# Patient Record
Sex: Female | Born: 1995 | Race: White | Hispanic: No | Marital: Single | Smoking: Current every day smoker
Health system: Southern US, Community
[De-identification: ages and names within clinical notes are randomized; demographics above are authoritative.]

## PROBLEM LIST (undated history)

## (undated) DIAGNOSIS — M419 Scoliosis, unspecified: Secondary | ICD-10-CM

## (undated) DIAGNOSIS — E282 Polycystic ovarian syndrome: Secondary | ICD-10-CM

## (undated) HISTORY — PX: NO PAST SURGERIES: SHX2092

## (undated) HISTORY — DX: Polycystic ovarian syndrome: E28.2

---

## 2020-02-27 ENCOUNTER — Other Ambulatory Visit: Payer: Self-pay

## 2020-02-27 DIAGNOSIS — Z20822 Contact with and (suspected) exposure to covid-19: Secondary | ICD-10-CM

## 2020-02-28 LAB — SARS-COV-2, NAA 2 DAY TAT

## 2020-02-28 LAB — NOVEL CORONAVIRUS, NAA: SARS-CoV-2, NAA: NOT DETECTED

## 2020-02-29 ENCOUNTER — Telehealth: Payer: Self-pay

## 2020-03-28 ENCOUNTER — Other Ambulatory Visit: Payer: Self-pay

## 2020-03-28 DIAGNOSIS — Z20822 Contact with and (suspected) exposure to covid-19: Secondary | ICD-10-CM

## 2020-04-01 LAB — NOVEL CORONAVIRUS, NAA: SARS-CoV-2, NAA: DETECTED — AB

## 2020-10-08 ENCOUNTER — Encounter: Payer: Self-pay | Admitting: *Deleted

## 2020-10-08 DIAGNOSIS — Z3400 Encounter for supervision of normal first pregnancy, unspecified trimester: Secondary | ICD-10-CM | POA: Insufficient documentation

## 2020-10-13 ENCOUNTER — Other Ambulatory Visit: Payer: Self-pay

## 2020-10-13 ENCOUNTER — Ambulatory Visit (INDEPENDENT_AMBULATORY_CARE_PROVIDER_SITE_OTHER): Payer: Medicaid Other | Admitting: Advanced Practice Midwife

## 2020-10-13 ENCOUNTER — Encounter: Payer: Self-pay | Admitting: Advanced Practice Midwife

## 2020-10-13 ENCOUNTER — Ambulatory Visit: Payer: Medicaid Other | Admitting: *Deleted

## 2020-10-13 VITALS — BP 105/60 | HR 81 | Ht 66.0 in | Wt 152.0 lb

## 2020-10-13 DIAGNOSIS — Z3A15 15 weeks gestation of pregnancy: Secondary | ICD-10-CM

## 2020-10-13 DIAGNOSIS — Z3402 Encounter for supervision of normal first pregnancy, second trimester: Secondary | ICD-10-CM

## 2020-10-13 LAB — POCT URINALYSIS DIPSTICK OB
Blood, UA: NEGATIVE
Glucose, UA: NEGATIVE
Leukocytes, UA: NEGATIVE
Nitrite, UA: NEGATIVE
POC,PROTEIN,UA: NEGATIVE

## 2020-10-13 MED ORDER — PROMETHAZINE HCL 25 MG PO TABS: 12.5000 mg | ORAL_TABLET | Freq: Four times a day (QID) | ORAL | 1 refills | Status: DC | PRN

## 2020-10-13 MED ORDER — ONDANSETRON 8 MG PO TBDP
8.0000 mg | ORAL_TABLET | Freq: Three times a day (TID) | ORAL | 3 refills | Status: DC | PRN
Start: 2020-10-13 — End: 2021-06-29

## 2020-10-13 NOTE — Progress Notes (Signed)
Subjective:   Heather Hendrix is a 25 y.o. G1P0 at [redacted]w[redacted]d by  early ultrasound being seen today for her first obstetrical visit.    Gynecological/Obstetrical History: Her obstetrical history is significant for  none . Patient does intend to breast feed. Pregnancy history fully reviewed. Patient reports no complaints. Sexual Activity and Vaginal Concerns  Medical History/ROS:   Social History: Patient endorses current usage of tobacco, about 1PPD x 6 years. She denies any alcohol, or drug use.  Patient reports the FOB is Jomarie Longs who is involved/lives with patient.  Patient reports that she lives with FOB and endorses safety at home.  Partner in the room, unable to ask DV questions. Patient is currently employed at [employer].  HISTORY: OB History  Gravida Para Term Preterm AB Living  1 0 0 0 0 0  SAB IAB Ectopic Multiple Live Births  0 0 0 0 0    # Outcome Date GA Lbr Len/2nd Weight Sex Delivery Anes PTL Lv  1 Current             Last pap smear was done 01/2020 and was  normal per patient, will get records.  Records requested from [facility]   Past Medical History:  Diagnosis Date   PCOS (polycystic ovarian syndrome)    Past Surgical History:  Procedure Laterality Date   NO PAST SURGERIES     Family History  Problem Relation Age of Onset   Heart disease Maternal Grandfather    COPD Paternal Grandmother    Social History   Tobacco Use   Smoking status: Every Day    Packs/day: 1.00    Types: Cigarettes   Smokeless tobacco: Never  Vaping Use   Vaping Use: Former  Substance Use Topics   Alcohol use: Not Currently   Drug use: Not Currently   No Known Allergies Current Outpatient Medications on File Prior to Visit  Medication Sig Dispense Refill   Prenatal Vit-Fe Fumarate-FA (PRENATAL VITAMIN PO) Take by mouth.     No current facility-administered medications on file prior to visit.    Review of Systems Pertinent items noted in HPI  and remainder of comprehensive ROS otherwise negative.  Exam   Vitals:   10/13/20 1338 10/13/20 1400  BP: 105/60   Pulse: 81   Weight: 152 lb (68.9 kg)   Height:  5\' 6"  (1.676 m)   Fetal Heart Rate (bpm): 140  Physical Exam Constitutional:      General: She is not in acute distress. HENT:     Head: Normocephalic.  Eyes:     Pupils: Pupils are equal, round, and reactive to light.  Cardiovascular:     Rate and Rhythm: Normal rate.  Pulmonary:     Effort: Pulmonary effort is normal.  Abdominal:     Palpations: Abdomen is soft.  Neurological:     Mental Status: She is alert and oriented to person, place, and time.  Skin:    General: Skin is warm and dry.  Psychiatric:        Mood and Affect: Mood normal.        Behavior: Behavior normal.  Vitals and nursing note reviewed.    Assessment:   25 y.o. year old G1P0 Patient Active Problem List   Diagnosis Date Noted   Supervision of normal first pregnancy 10/08/2020     Plan:  1. Encounter for supervision of normal first pregnancy in second trimester - routine care - will send ROI  for pap results   2. [redacted] weeks gestation of pregnancy - Anatomy scan at next visit      Problem list reviewed and updated. Routine obstetric precautions reviewed.  Orders Placed This Encounter  Procedures   GC/Chlamydia Probe Amp   Urine Culture   AFP, Serum, Open Spina Bifida    Order Specific Question:   Is patient insulin dependent?    Answer:   No    Order Specific Question:   Weight (lbs)    Answer:   70    Order Specific Question:   Gestational Age (GA), weeks    Answer:   15.2    Order Specific Question:   Date on which patient was at this GA    Answer:   10/13/2020    Order Specific Question:   GA Calculation Method    Answer:   Ultrasound    Order Specific Question:   Number of fetuses    Answer:   1   Genetic Screening    Panorama/Horizon   CBC/D/Plt+RPR+Rh+ABO+RubIgG...   Pain Management Screening Profile (10S)    POC Urinalysis Dipstick OB    Return in about 4 weeks (around 11/10/2020).     Thressa Sheller DNP, CNM  10/13/20  3:00 PM

## 2020-10-14 LAB — PMP SCREEN PROFILE (10S), URINE
Amphetamine Scrn, Ur: NEGATIVE ng/mL
BARBITURATE SCREEN URINE: NEGATIVE ng/mL
BENZODIAZEPINE SCREEN, URINE: NEGATIVE ng/mL
CANNABINOIDS UR QL SCN: POSITIVE ng/mL — AB
Cocaine (Metab) Scrn, Ur: NEGATIVE ng/mL
Creatinine(Crt), U: 185.3 mg/dL (ref 20.0–300.0)
Methadone Screen, Urine: NEGATIVE ng/mL
OXYCODONE+OXYMORPHONE UR QL SCN: POSITIVE ng/mL — AB
Opiate Scrn, Ur: POSITIVE ng/mL — AB
Ph of Urine: 6.1 (ref 4.5–8.9)
Phencyclidine Qn, Ur: NEGATIVE ng/mL
Propoxyphene Scrn, Ur: NEGATIVE ng/mL

## 2020-10-14 LAB — GC/CHLAMYDIA PROBE AMP
Chlamydia trachomatis, NAA: NEGATIVE
Neisseria Gonorrhoeae by PCR: NEGATIVE

## 2020-10-15 LAB — CBC/D/PLT+RPR+RH+ABO+RUBIGG...
Antibody Screen: NEGATIVE
Basophils Absolute: 0 10*3/uL (ref 0.0–0.2)
Basos: 0 %
EOS (ABSOLUTE): 0.1 10*3/uL (ref 0.0–0.4)
Eos: 1 %
HCV Ab: 0.1 s/co ratio (ref 0.0–0.9)
HIV Screen 4th Generation wRfx: NONREACTIVE
Hematocrit: 33.7 % — ABNORMAL LOW (ref 34.0–46.6)
Hemoglobin: 11.3 g/dL (ref 11.1–15.9)
Hepatitis B Surface Ag: NEGATIVE
Immature Grans (Abs): 0 10*3/uL (ref 0.0–0.1)
Immature Granulocytes: 0 %
Lymphocytes Absolute: 2.9 10*3/uL (ref 0.7–3.1)
Lymphs: 26 %
MCH: 31.2 pg (ref 26.6–33.0)
MCHC: 33.5 g/dL (ref 31.5–35.7)
MCV: 93 fL (ref 79–97)
Monocytes Absolute: 0.7 10*3/uL (ref 0.1–0.9)
Monocytes: 7 %
Neutrophils Absolute: 7.1 10*3/uL — ABNORMAL HIGH (ref 1.4–7.0)
Neutrophils: 66 %
Platelets: 191 10*3/uL (ref 150–450)
RBC: 3.62 x10E6/uL — ABNORMAL LOW (ref 3.77–5.28)
RDW: 12.9 % (ref 11.7–15.4)
RPR Ser Ql: NONREACTIVE
Rh Factor: POSITIVE
Rubella Antibodies, IGG: <0.9 index — ABNORMAL LOW (ref >0.99)
WBC: 10.8 10*3/uL (ref 3.4–10.8)

## 2020-10-15 LAB — AFP, SERUM, OPEN SPINA BIFIDA
AFP MoM: 0.78
AFP Value: 24.2 ng/mL
Gest. Age on Collection Date: 15.2 weeks
Maternal Age At EDD: 23.2 yr
OSBR Risk 1 IN: 10000
Test Results:: NEGATIVE
Weight: 152 [lb_av]

## 2020-10-15 LAB — HCV INTERPRETATION

## 2020-10-16 LAB — URINE CULTURE

## 2020-10-22 ENCOUNTER — Encounter: Payer: Self-pay | Admitting: Advanced Practice Midwife

## 2020-11-03 ENCOUNTER — Encounter: Payer: Self-pay | Admitting: Advanced Practice Midwife

## 2020-11-13 ENCOUNTER — Telehealth: Payer: Self-pay

## 2020-11-17 ENCOUNTER — Other Ambulatory Visit: Payer: Medicaid Other

## 2020-11-17 ENCOUNTER — Encounter: Payer: Medicaid Other | Admitting: Women's Health

## 2020-11-28 ENCOUNTER — Other Ambulatory Visit: Payer: Self-pay | Admitting: Women's Health

## 2020-11-28 DIAGNOSIS — Z363 Encounter for antenatal screening for malformations: Secondary | ICD-10-CM

## 2020-12-01 ENCOUNTER — Other Ambulatory Visit: Payer: Medicaid Other

## 2020-12-08 ENCOUNTER — Other Ambulatory Visit: Payer: Self-pay

## 2020-12-08 ENCOUNTER — Encounter: Payer: Self-pay | Admitting: Women's Health

## 2020-12-08 ENCOUNTER — Ambulatory Visit (INDEPENDENT_AMBULATORY_CARE_PROVIDER_SITE_OTHER): Payer: Medicaid Other | Admitting: Women's Health

## 2020-12-08 VITALS — BP 115/63 | HR 92 | Wt 159.0 lb

## 2020-12-08 DIAGNOSIS — Z2839 Other underimmunization status: Secondary | ICD-10-CM

## 2020-12-08 DIAGNOSIS — O09899 Supervision of other high risk pregnancies, unspecified trimester: Secondary | ICD-10-CM

## 2020-12-08 DIAGNOSIS — F1290 Cannabis use, unspecified, uncomplicated: Secondary | ICD-10-CM

## 2020-12-08 DIAGNOSIS — Z3402 Encounter for supervision of normal first pregnancy, second trimester: Secondary | ICD-10-CM

## 2020-12-08 NOTE — Progress Notes (Signed)
LOW-RISK PREGNANCY VISIT Patient name: Heather Hendrix MRN [id number]  Date of birth: 04-Jun-1995 Chief Complaint:   Routine Prenatal Visit  History of Present Illness:   Heather Hendrix is a 25 y.o. G1P0 female at [redacted]w[redacted]d with an Estimated Date of Delivery: 04/04/21 being seen today for ongoing management of a low-risk pregnancy.   Today she reports  low back pain. Took pain pill her neighbor gave her, doesn't know what it was. Discussed her UDS was +for THC/opiates/oxy. Admits to [facility] use to help w/ nausea/ability to eat. Has rx for phenergan and zofran she hasn't picked up. Had to reschedule appts d/t death in family, so hasn't had anatomy u/s. Marland Kitchen Contractions: Not present.  .  Movement: Present. denies leaking of fluid.  Depression screen [facility] 2/9 10/13/2020  Decreased Interest 2  Down, Depressed, Hopeless 1  PHQ - 2 Score 3  Altered sleeping 2  Tired, decreased energy 3  Change in appetite 3  Feeling bad or failure about yourself  1  Trouble concentrating 0  Moving slowly or fidgety/restless 0  Suicidal thoughts 0  PHQ-9 Score 12     GAD 7 : Generalized Anxiety Score 10/13/2020  Nervous, Anxious, on Edge 1  Control/stop worrying 0  Worry too much - different things 2  Trouble relaxing 0  Restless 0  Easily annoyed or irritable 2  Afraid - awful might happen 1  Total GAD 7 Score 6      Review of Systems:   Pertinent items are noted in HPI Denies abnormal vaginal discharge w/ itching/odor/irritation, headaches, visual changes, shortness of breath, chest pain, abdominal pain, severe nausea/vomiting, or problems with urination or bowel movements unless otherwise stated above. Pertinent History Reviewed:  Reviewed past medical,surgical, social, obstetrical and family history.  Reviewed problem list, medications and allergies. Physical Assessment:   Vitals:   12/08/20 1339  BP: 115/63  Pulse: 92  Weight: 159 lb (72.1 kg)  Body mass index is 25.66 kg/m.         Physical Examination:   General appearance: Well appearing, and in no distress  Mental status: Alert, oriented to person, place, and time  Skin: Warm & dry  Cardiovascular: Normal heart rate noted  Respiratory: Normal respiratory effort, no distress  Abdomen: Soft, gravid, nontender  Pelvic: Cervical exam deferred         Extremities: Edema: Trace  Fetal Status: Fetal Heart Rate (bpm): 141 Fundal Height: 23 cm Movement: Present    Chaperone: N/A   No results found for this or any previous visit (from the past 24 hour(s)).  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 Uses for nausea/appetite. Has 2 rx's for nausea hasn't picked up, get them and take prn  3) UDS +opiate/oxy> reports she took a pain pill given to her by her neighbor. Advised not to take meds from anyone else  4) Low back pain> discussed and gave printed prevention/relief measures    Meds: No orders of the defined types were placed in this encounter.  Labs/procedures today: declined flu shot  Plan:  Continue routine obstetrical care  Next visit: prefers in person    Reviewed: Preterm labor symptoms and general obstetric precautions including but not limited to vaginal bleeding, contractions, leaking of fluid and fetal movement were reviewed in detail with the patient.  All questions were answered. Does have  home bp cuff. Office bp cuff given: not applicable. Check bp weekly, let us know if consistently >140 and/or >90.  Follow-up: Return for ASAP, MP:NTIRWER (no visit), then 4wks from now for LROB w/ pn2.  No future appointments.   No orders of the defined types were placed in this encounter.  Cheral Marker CNM, [facility] 12/08/2020 2:17 PM

## 2020-12-08 NOTE — Patient Instructions (Addendum)
Lelon Mast, thank you for choosing our office today! We appreciate the opportunity to meet your healthcare needs. You may receive a short survey by mail, e-mail, or through [employer]. If you are happy with your care we would appreciate if you could take just a few minutes to complete the survey questions. We read all of your comments and take your feedback very seriously. Thank you again for choosing our office.  Center for [employer] Team at [facility]  [facility] at [facility] ([address] [place], [place] [address]) Entrance C, located off of [address] valet parking   For your lower back pain you may: Purchase a pregnancy/maternity support belt from [employer], [employer], [employer], [employer], etc and wear it while you are up and about Take warm baths Use a heating pad to your lower back for no longer than 20 minutes at a time, and do not place near abdomen Take tylenol as needed. Please follow directions on the bottle Kinesiology tape (can get from sporting goods store), google how to tape belly for pregnancy   You will have your sugar test next visit.  Please do not eat or drink anything after midnight the night before you come, not even water.  You will be here for at least two hours.  Please make an appointment online for the bloodwork at [email or web address] for 8:00am (or as close to this as possible). Make sure you select the [facility] service center.   CLASSES: Go to [email or web address] to register for classes (childbirth, breastfeeding, waterbirth, infant CPR, daddy bootcamp, etc.)  Call the office [phone]) or go to [facility] if: You begin to have strong, frequent contractions Your water breaks.  Sometimes it is a big gush of fluid, sometimes it is just a trickle that keeps getting your panties wet or running down your legs You have vaginal bleeding.  It is normal to have a small amount of spotting if your cervix was checked.  You don't feel  your baby moving like normal.  If you don't, get you something to eat and drink and lay down and focus on feeling your baby move.   If your baby is still not moving like normal, you should call the office or go to [facility].  Call the office [phone]) or go to [facility] for these signs of pre-eclampsia: Severe headache that does not go away with Tylenol Visual changes- seeing spots, double, blurred vision Pain under your right breast or upper abdomen that does not go away with Tums or heartburn medicine Nausea and/or vomiting Severe swelling in your hands, feet, and face    [facility] Pediatricians/Family Doctors [facility]): [address] Colette Ribas, [phone]           [facility]: [address], [phone]                [facility]): [address], [phone] (call to ask if accepting patients) [facility] Department: [address] [place], [phone]    [facility] Pediatricians/Family Doctors [facility]): [address], [phone] [facility]: [address] [place], [phone] [facility]: [address], [phone]  [facility] Doctors  [facility]): [phone] [facility]: [address] [phone]   [facility]: [address], [phone]  [facility]  [facility]: [address], [phone]  Home Blood Pressure Monitoring for Patients   Your provider has recommended that you check your blood pressure (BP) at least once a week at home. If you do not have a blood pressure cuff at home, one will be provided for you. Contact your provider if you have not received your monitor within 1 week.   Helpful Tips for Accurate Home Blood Pressure Checks  Don't smoke, exercise, or  drink caffeine 30 minutes before checking your BP Use the restroom before checking your BP (a full bladder can raise your pressure) Relax in a comfortable upright chair Feet on the ground Left arm resting comfortably on a flat surface at the level of your heart Legs uncrossed Back supported Sit quietly and don't talk Place the cuff on your bare arm Adjust snuggly, so that only two fingertips can fit between your skin and the top of the cuff Check 2 readings separated by at least one minute Keep a log of your BP readings For a visual, please reference this diagram: http://ccnc.care/bpdiagram  Provider Name: [facility]     Phone: [phone]  Zone 1: ALL CLEAR  Continue to monitor your symptoms:  BP reading is less than 140 (top number) or less than 90 (bottom number)  No right upper stomach pain No headaches or seeing spots No feeling nauseated or throwing up No swelling in face and hands  Zone 2: CAUTION Call your doctor's office for any of the following:  BP reading is greater than 140 (top number) or greater than 90 (bottom number)  Stomach pain under your ribs in the middle or right side Headaches or seeing spots Feeling nauseated or throwing up Swelling in face and hands  Zone 3: EMERGENCY  Seek immediate medical care if you have any of the following:  BP reading is greater than160 (top number) or greater than 110 (bottom number) Severe headaches not improving with Tylenol Serious difficulty catching your breath Any worsening symptoms from Zone 2   Second Trimester of Pregnancy The second trimester is from week 13 through week 28, months 4 through 6. The second trimester is often a time when you feel your best. Your body has also adjusted to being pregnant, and you begin to feel better physically. Usually, morning sickness has lessened or quit completely, you may have more energy, and you may have an increase in appetite. The second trimester is also a time when  the fetus is growing rapidly. At the end of the sixth month, the fetus is about 9 inches long and weighs about 1 pounds. You will likely begin to feel the baby move (quickening) between 18 and 20 weeks of the pregnancy. BODY CHANGES Your body goes through many changes during pregnancy. The changes vary from woman to woman.  Your weight will continue to increase. You will notice your lower abdomen bulging out. You may begin to get stretch marks on your hips, abdomen, and breasts. You may develop headaches that can be relieved by medicines approved by your health care provider. You may urinate more often because the fetus is pressing on your bladder. You may develop or continue to have heartburn as a result of your pregnancy. You may develop constipation because certain hormones are causing the muscles that push waste through your intestines to slow down. You may develop hemorrhoids or swollen, bulging veins (varicose veins). You may have back pain because of the weight gain and pregnancy hormones relaxing your joints  between the bones in your pelvis and as a result of a shift in weight and the muscles that support your balance. Your breasts will continue to grow and be tender. Your gums may bleed and may be sensitive to brushing and flossing. Dark spots or blotches (chloasma, mask of pregnancy) may develop on your face. This will likely fade after the baby is born. A dark line from your belly button to the pubic area (linea nigra) may appear. This will likely fade after the baby is born. You may have changes in your hair. These can include thickening of your hair, rapid growth, and changes in texture. Some women also have hair loss during or after pregnancy, or hair that feels dry or thin. Your hair will most likely return to normal after your baby is born. WHAT TO EXPECT AT YOUR PRENATAL VISITS During a routine prenatal visit: You will be weighed to make sure you and the fetus are growing  normally. Your blood pressure will be taken. Your abdomen will be measured to track your baby's growth. The fetal heartbeat will be listened to. Any test results from the previous visit will be discussed. Your health care provider may ask you: How you are feeling. If you are feeling the baby move. If you have had any abnormal symptoms, such as leaking fluid, bleeding, severe headaches, or abdominal cramping. If you have any questions. Other tests that may be performed during your second trimester include: Blood tests that check for: Low iron levels (anemia). Gestational diabetes (between 24 and 28 weeks). Rh antibodies. Urine tests to check for infections, diabetes, or protein in the urine. An ultrasound to confirm the proper growth and development of the baby. An amniocentesis to check for possible genetic problems. Fetal screens for spina bifida and Down syndrome. HOME CARE INSTRUCTIONS  Avoid all smoking, herbs, alcohol, and unprescribed drugs. These chemicals affect the formation and growth of the baby. Follow your health care provider's instructions regarding medicine use. There are medicines that are either safe or unsafe to take during pregnancy. Exercise only as directed by your health care provider. Experiencing uterine cramps is a good sign to stop exercising. Continue to eat regular, healthy meals. Wear a good support bra for breast tenderness. Do not use hot tubs, steam rooms, or saunas. Wear your seat belt at all times when driving. Avoid raw meat, uncooked cheese, cat litter boxes, and soil used by cats. These carry germs that can cause birth defects in the baby. Take your prenatal vitamins. Try taking a stool softener (if your health care provider approves) if you develop constipation. Eat more high-fiber foods, such as fresh vegetables or fruit and whole grains. Drink plenty of fluids to keep your urine clear or pale yellow. Take warm sitz baths to soothe any pain or  discomfort caused by hemorrhoids. Use hemorrhoid cream if your health care provider approves. If you develop varicose veins, wear support hose. Elevate your feet for 15 minutes, 3-4 times a day. Limit salt in your diet. Avoid heavy lifting, wear low heel shoes, and practice good posture. Rest with your legs elevated if you have leg cramps or low back pain. Visit your dentist if you have not gone yet during your pregnancy. Use a soft toothbrush to brush your teeth and be gentle when you floss. A sexual relationship may be continued unless your health care provider directs you otherwise. Continue to go to all your prenatal visits as directed by your health care provider. SEEK MEDICAL CARE  IF:  You have dizziness. You have mild pelvic cramps, pelvic pressure, or nagging pain in the abdominal area. You have persistent nausea, vomiting, or diarrhea. You have a bad smelling vaginal discharge. You have pain with urination. SEEK IMMEDIATE MEDICAL CARE IF:  You have a fever. You are leaking fluid from your vagina. You have spotting or bleeding from your vagina. You have severe abdominal cramping or pain. You have rapid weight gain or loss. You have shortness of breath with chest pain. You notice sudden or extreme swelling of your face, hands, ankles, feet, or legs. You have not felt your baby move in over an hour. You have severe headaches that do not go away with medicine. You have vision changes. Document Released: 03/02/2001 Document Revised: 03/13/2013 Document Reviewed: 05/09/2012 [facility] Patient Information 2015 [place], [place]. This information is not intended to replace advice given to you by your health care provider. Make sure you discuss any questions you have with your health care provider.

## 2020-12-24 ENCOUNTER — Other Ambulatory Visit: Payer: Self-pay

## 2020-12-24 ENCOUNTER — Ambulatory Visit (INDEPENDENT_AMBULATORY_CARE_PROVIDER_SITE_OTHER): Payer: Medicaid Other

## 2020-12-24 DIAGNOSIS — Z3402 Encounter for supervision of normal first pregnancy, second trimester: Secondary | ICD-10-CM

## 2020-12-24 DIAGNOSIS — Z363 Encounter for antenatal screening for malformations: Secondary | ICD-10-CM

## 2020-12-24 DIAGNOSIS — Z3A25 25 weeks gestation of pregnancy: Secondary | ICD-10-CM

## 2020-12-24 NOTE — Progress Notes (Signed)
[place] 25+4 wks,cephalic,anterior placenta gr 0,normal ovaries,CX length 2.9 cm,FHR 157 bpm,AFI 12.6 cm,EFW 830 g 40%,anatomy complete,no obvious abnormalities

## 2021-01-05 ENCOUNTER — Ambulatory Visit (INDEPENDENT_AMBULATORY_CARE_PROVIDER_SITE_OTHER): Payer: Medicaid Other | Admitting: Women's Health

## 2021-01-05 ENCOUNTER — Other Ambulatory Visit: Payer: Medicaid Other

## 2021-01-05 ENCOUNTER — Other Ambulatory Visit: Payer: Self-pay

## 2021-01-05 ENCOUNTER — Encounter: Payer: Self-pay | Admitting: Women's Health

## 2021-01-05 VITALS — BP 108/67 | HR 87 | Wt 165.0 lb

## 2021-01-05 DIAGNOSIS — Z3A27 27 weeks gestation of pregnancy: Secondary | ICD-10-CM

## 2021-01-05 DIAGNOSIS — Z3402 Encounter for supervision of normal first pregnancy, second trimester: Secondary | ICD-10-CM

## 2021-01-05 NOTE — Patient Instructions (Addendum)
Heather Hendrix, thank you for choosing our office today! We appreciate the opportunity to meet your healthcare needs. You may receive a short survey by mail, e-mail, or through MyChart. If you are happy with your care we would appreciate if you could take just a few minutes to complete the survey questions. We read all of your comments and take your feedback very seriously. Thank you again for choosing our office.  [facility] ([address] [place], [place] [address]) Entrance C, located off of [address] Free 24/7 valet parking   CLASSES: Go to [email or web address] to register for classes (childbirth, breastfeeding, waterbirth, infant CPR, daddy bootcamp, etc.)  Call the office ([phone]) or go to [facility] if: You begin to have strong, frequent contractions Your water breaks.  Sometimes it is a big gush of fluid, sometimes it is just a trickle that keeps getting your panties wet or running down your legs You have vaginal bleeding.  It is normal to have a small amount of spotting if your cervix was checked.  You don't feel your baby moving like normal.  If you don't, get you something to eat and drink and lay down and focus on feeling your baby move.   If your baby is still not moving like normal, you should call the office or go to [facility].  Call the office ([phone]) or go to [facility] for these signs of pre-eclampsia: Severe headache that does not go away with Tylenol Visual changes- seeing spots, double, blurred vision Pain under your right breast or upper abdomen that does not go away with Tums or heartburn medicine Nausea and/or vomiting Severe swelling in your hands, feet, and face   Tdap Vaccine It is recommended that you get the Tdap vaccine during the third trimester of EACH pregnancy to help protect your baby from getting pertussis (whooping cough) 27-36 weeks is the BEST time to do  this so that you can pass the protection on to your baby. During pregnancy is better than after pregnancy, but if you are unable to get it during pregnancy it will be offered at the hospital.  You can get this vaccine with us, at the health department, your family doctor, or some local pharmacies Everyone who will be around your baby should also be up-to-date on their vaccines before the baby comes. Adults (who are not pregnant) only need 1 dose of Tdap during adulthood.   [place] Pediatricians/Family Doctors [facility] ([facility]): [address], [phone]           [facility]: [address], [phone]                [facility] ([facility]): [address], [phone] (call to ask if accepting patients) [facility]: [address], [place], [phone]    [place] Pediatricians/Family Doctors [facility] ([facility]): [address], [phone] [facility]: [address], [phone] [facility]: [address], [phone]  [place] [facility]  [facility] ([facility]): [phone] [facility]: [address], [phone]   [place] Family [facility]: [address], [phone]  [place] [facility]  [facility]: [address], [phone]  Home Blood Pressure Monitoring for Patients   Your provider has recommended that you check your   blood pressure (BP) at least once a week at home. If you do not have a blood pressure cuff at home, one will be provided for you. Contact your provider if you have not received your monitor within 1 week.   Helpful Tips for Accurate Home Blood Pressure Checks  Don't smoke, exercise, or drink caffeine 30 minutes before checking your BP Use the restroom before checking your BP (a full bladder can raise your  pressure) Relax in a comfortable upright chair Feet on the ground Left arm resting comfortably on a flat surface at the level of your heart Legs uncrossed Back supported Sit quietly and don't talk Place the cuff on your bare arm Adjust snuggly, so that only two fingertips can fit between your skin and the top of the cuff Check 2 readings separated by at least one minute Keep a log of your BP readings For a visual, please reference this diagram: http://ccnc.care/bpdiagram  Provider Name: [facility]     Phone: [phone]  Zone 1: ALL CLEAR  Continue to monitor your symptoms:  BP reading is less than 140 (top number) or less than 90 (bottom number)  No right upper stomach pain No headaches or seeing spots No feeling nauseated or throwing up No swelling in face and hands  Zone 2: CAUTION Call your doctor's office for any of the following:  BP reading is greater than 140 (top number) or greater than 90 (bottom number)  Stomach pain under your ribs in the middle or right side Headaches or seeing spots Feeling nauseated or throwing up Swelling in face and hands  Zone 3: EMERGENCY  Seek immediate medical care if you have any of the following:  BP reading is greater than160 (top number) or greater than 110 (bottom number) Severe headaches not improving with Tylenol Serious difficulty catching your breath Any worsening symptoms from Zone 2   Third Trimester of Pregnancy The third trimester is from week 29 through week 42, months 7 through 9. The third trimester is a time when the fetus is growing rapidly. At the end of the ninth month, the fetus is about 20 inches in length and weighs 6-10 pounds.  BODY CHANGES Your body goes through many changes during pregnancy. The changes vary from woman to woman.  Your weight will continue to increase. You can expect to gain 25-35 pounds (11-16 kg) by the end of the pregnancy. You may begin to get stretch marks on your hips, abdomen,  and breasts. You may urinate more often because the fetus is moving lower into your pelvis and pressing on your bladder. You may develop or continue to have heartburn as a result of your pregnancy. You may develop constipation because certain hormones are causing the muscles that push waste through your intestines to slow down. You may develop hemorrhoids or swollen, bulging veins (varicose veins). You may have pelvic pain because of the weight gain and pregnancy hormones relaxing your joints between the bones in your pelvis. Backaches may result from overexertion of the muscles supporting your posture. You may have changes in your hair. These can include thickening of your hair, rapid growth, and changes in texture. Some women also have hair loss during or after pregnancy, or hair that feels dry or thin. Your hair will most likely return to normal after your baby is born. Your breasts will continue to grow and be tender. A yellow discharge may leak from your breasts called colostrum. Your belly button may stick out. You may   feel short of breath because of your expanding uterus. You may notice the fetus "dropping," or moving lower in your abdomen. You may have a bloody mucus discharge. This usually occurs a few days to a week before labor begins. Your cervix becomes thin and soft (effaced) near your due date. WHAT TO EXPECT AT YOUR PRENATAL EXAMS  You will have prenatal exams every 2 weeks until week 36. Then, you will have weekly prenatal exams. During a routine prenatal visit: You will be weighed to make sure you and the fetus are growing normally. Your blood pressure is taken. Your abdomen will be measured to track your baby's growth. The fetal heartbeat will be listened to. Any test results from the previous visit will be discussed. You may have a cervical check near your due date to see if you have effaced. At around 36 weeks, your caregiver will check your cervix. At the same time, your  caregiver will also perform a test on the secretions of the vaginal tissue. This test is to determine if a type of bacteria, Group B streptococcus, is present. Your caregiver will explain this further. Your caregiver may ask you: What your birth plan is. How you are feeling. If you are feeling the baby move. If you have had any abnormal symptoms, such as leaking fluid, bleeding, severe headaches, or abdominal cramping. If you have any questions. Other tests or screenings that may be performed during your third trimester include: Blood tests that check for low iron levels (anemia). Fetal testing to check the health, activity level, and growth of the fetus. Testing is done if you have certain medical conditions or if there are problems during the pregnancy. FALSE LABOR You may feel small, irregular contractions that eventually go away. These are called Braxton Hicks contractions, or false labor. Contractions may last for hours, days, or even weeks before true labor sets in. If contractions come at regular intervals, intensify, or become painful, it is best to be seen by your caregiver.  SIGNS OF LABOR  Menstrual-like cramps. Contractions that are 5 minutes apart or less. Contractions that start on the top of the uterus and spread down to the lower abdomen and back. A sense of increased pelvic pressure or back pain. A watery or bloody mucus discharge that comes from the vagina. If you have any of these signs before the 37th week of pregnancy, call your caregiver right away. You need to go to the hospital to get checked immediately. HOME CARE INSTRUCTIONS  Avoid all smoking, herbs, alcohol, and unprescribed drugs. These chemicals affect the formation and growth of the baby. Follow your caregiver's instructions regarding medicine use. There are medicines that are either safe or unsafe to take during pregnancy. Exercise only as directed by your caregiver. Experiencing uterine cramps is a good sign to  stop exercising. Continue to eat regular, healthy meals. Wear a good support bra for breast tenderness. Do not use hot tubs, steam rooms, or saunas. Wear your seat belt at all times when driving. Avoid raw meat, uncooked cheese, cat litter boxes, and soil used by cats. These carry germs that can cause birth defects in the baby. Take your prenatal vitamins. Try taking a stool softener (if your caregiver approves) if you develop constipation. Eat more high-fiber foods, such as fresh vegetables or fruit and whole grains. Drink plenty of fluids to keep your urine clear or pale yellow. Take warm sitz baths to soothe any pain or discomfort caused by hemorrhoids. Use hemorrhoid cream if   your caregiver approves. If you develop varicose veins, wear support hose. Elevate your feet for 15 minutes, 3-4 times a day. Limit salt in your diet. Avoid heavy lifting, wear low heal shoes, and practice good posture. Rest a lot with your legs elevated if you have leg cramps or low back pain. Visit your dentist if you have not gone during your pregnancy. Use a soft toothbrush to brush your teeth and be gentle when you floss. A sexual relationship may be continued unless your caregiver directs you otherwise. Do not travel far distances unless it is absolutely necessary and only with the approval of your caregiver. Take prenatal classes to understand, practice, and ask questions about the labor and delivery. Make a trial run to the hospital. Pack your hospital bag. Prepare the baby's nursery. Continue to go to all your prenatal visits as directed by your caregiver. SEEK MEDICAL CARE IF: You are unsure if you are in labor or if your water has broken. You have dizziness. You have mild pelvic cramps, pelvic pressure, or nagging pain in your abdominal area. You have persistent nausea, vomiting, or diarrhea. You have a bad smelling vaginal discharge. You have pain with urination. SEEK IMMEDIATE MEDICAL CARE IF:  You  have a fever. You are leaking fluid from your vagina. You have spotting or bleeding from your vagina. You have severe abdominal cramping or pain. You have rapid weight loss or gain. You have shortness of breath with chest pain. You notice sudden or extreme swelling of your face, hands, ankles, feet, or legs. You have not felt your baby move in over an hour. You have severe headaches that do not go away with medicine. You have vision changes. Document Released: 03/02/2001 Document Revised: 03/13/2013 Document Reviewed: 05/09/2012 [facility] Patient Information 2015 [place], [place]. This information is not intended to replace advice given to you by your health care provider. Make sure you discuss any questions you have with your health care provider.   AREA PEDIATRIC/FAMILY PRACTICE PHYSICIANS  [facility] [address] [place], [place] [address] Phone - [phone]   Fax - [phone]  JACK AMOS [address] [place], [place]  [address] Phone - [phone]   Fax - [phone]  [facility] [address] [place], [place]  [address] Phone - [phone]   Fax - [phone]  [facility] [address] [place], [place]  [address] Phone - [phone]   Fax - [phone]  [facility] [address] [place], [place]  [address] Phone - [phone]   Fax - [phone]  [facility] [address] [place], [place]  [address] Phone - [phone]   Fax - [phone]  [facility] [place] [address] [place], [place]  [address] Phone - [phone]   Fax - [phone]  [facility] AT [facility] [address] [place], [address] [place], [place]  [address] Phone - [phone]   Fax - [phone]  [facility] AT [facility] [address] [place], [place]  [address] Phone - [phone]   Fax -  [phone] [facility] AT [place] [address] [place], [place]  [address] Phone - [phone]   Fax - [phone]  [facility] [address] [place], [place]  [address] Phone - [phone]   Fax - [phone]  [facility] [address] [place], [place]  [address] Phone - [phone]   Fax - [phone]  [facility] [address] [place], [place]  [address] Phone - [phone]   Fax - [phone]  [facility] [address], [address] [place], [place]  [address] Phone - [phone]  [facility] [address] [place], [place]  [address] Phone - [phone]   Fax - [phone]  [facility] [address] [place], [place]  [address] Phone - [phone]   Fax - [phone]  [facility] [address] [place], [place]  [address] Phone - [phone]   Fax - [phone]  [facility] [address] [place], [place]  [address] Phone - [phone]   Fax - [phone]   [facility] [address] [place], [place]  [address] Phone - [phone]   Fax - [phone]  [facility] [address] [place], [place]  [address] Phone - [phone]   Fax - [phone]  DAVID RUBIN [address] [place], [place]  [address] Phone - [phone]   Fax - [phone]  [facility] [address] [place], [place]  [address] Phone - [phone]   Fax - [phone]  [facility] Al[facility] [address] [place], [place]  [address] Phone - [phone]   Fax - [phone] Gerarda Fraction [address], [place]  [address] Phone - [phone]   Fax - [phone]  [facility] [address] [place], [place]  [address] Phone - [phone]   Fax - [phone]  [facility] [place] [address] [place], [place]  [address] Phone - [phone]   Fax - [phone]

## 2021-01-05 NOTE — Progress Notes (Signed)
    LOW-RISK PREGNANCY VISIT Patient name: Heather Hendrix MRN [id number]  Date of birth: 1995-06-26 Chief Complaint:   Routine Prenatal Visit  History of Present Illness:   Heather Hendrix is a 25 y.o. G1P0 female at [redacted]w[redacted]d with an Estimated Date of Delivery: 04/04/21 being seen today for ongoing management of a low-risk pregnancy.   Today she reports no complaints. Contractions: Irritability. Vag. Bleeding: None.  Movement: Present. denies leaking of fluid.  Depression screen [facility] 2/9 01/05/2021 10/13/2020  Decreased Interest 1 2  Down, Depressed, Hopeless 1 1  PHQ - 2 Score 2 3  Altered sleeping 1 2  Tired, decreased energy 2 3  Change in appetite 1 3  Feeling bad or failure about yourself  0 1  Trouble concentrating 1 0  Moving slowly or fidgety/restless 0 0  Suicidal thoughts 0 0  PHQ-9 Score 7 12     GAD 7 : Generalized Anxiety Score 01/05/2021 10/13/2020  Nervous, Anxious, on Edge 0 1  Control/stop worrying 0 0  Worry too much - different things 1 2  Trouble relaxing 1 0  Restless 0 0  Easily annoyed or irritable 2 2  Afraid - awful might happen 0 1  Total GAD 7 Score 4 6      Review of Systems:   Pertinent items are noted in HPI Denies abnormal vaginal discharge w/ itching/odor/irritation, headaches, visual changes, shortness of breath, chest pain, abdominal pain, severe nausea/vomiting, or problems with urination or bowel movements unless otherwise stated above. Pertinent History Reviewed:  Reviewed past medical,surgical, social, obstetrical and family history.  Reviewed problem list, medications and allergies. Physical Assessment:   Vitals:   01/05/21 0851  BP: 108/67  Pulse: 87  Weight: 165 lb (74.8 kg)  Body mass index is 26.63 kg/m.        Physical Examination:   General appearance: Well appearing, and in no distress  Mental status: Alert, oriented to person, place, and time  Skin: Warm & dry  Cardiovascular: Normal heart rate noted  Respiratory:  Normal respiratory effort, no distress  Abdomen: Soft, gravid, nontender  Pelvic: Cervical exam deferred         Extremities: Edema: Trace  Fetal Status: Fetal Heart Rate (bpm): 134 Fundal Height: 25 cm Movement: Present    Chaperone: N/A   No results found for this or any previous visit (from the past 24 hour(s)).  Assessment & Plan:  1) Low-risk pregnancy G1P0 at [redacted]w[redacted]d with an Estimated Date of Delivery: 04/04/21    Meds: No orders of the defined types were placed in this encounter.  Labs/procedures today: PN2, declined flu shot, and declined tdap-may want next time  Plan:  Continue routine obstetrical care  Next visit: prefers in person    Reviewed: Preterm labor symptoms and general obstetric precautions including but not limited to vaginal bleeding, contractions, leaking of fluid and fetal movement were reviewed in detail with the patient.  All questions were answered. Does have home bp cuff. Office bp cuff given: not applicable. Check bp weekly, let us know if consistently >140 and/or >90.  Follow-up: Return in about 4 weeks (around 02/02/2021) for LROB, CNM, in person.  No future appointments.  No orders of the defined types were placed in this encounter.  Cheral Marker CNM, [facility] 01/05/2021 9:03 AM

## 2021-01-06 LAB — GLUCOSE TOLERANCE, 2 HOURS W/ 1HR
Glucose, 1 hour: 150 mg/dL (ref 70–179)
Glucose, 2 hour: 101 mg/dL (ref 70–152)
Glucose, Fasting: 83 mg/dL (ref 70–91)

## 2021-01-06 LAB — CBC
Hematocrit: 34.3 % (ref 34.0–46.6)
Hemoglobin: 11.7 g/dL (ref 11.1–15.9)
MCH: 32 pg (ref 26.6–33.0)
MCHC: 34.1 g/dL (ref 31.5–35.7)
MCV: 94 fL (ref 79–97)
Platelets: 193 10*3/uL (ref 150–450)
RBC: 3.66 x10E6/uL — ABNORMAL LOW (ref 3.77–5.28)
RDW: 11.8 % (ref 11.7–15.4)
WBC: 10.2 10*3/uL (ref 3.4–10.8)

## 2021-01-06 LAB — ANTIBODY SCREEN: Antibody Screen: NEGATIVE

## 2021-01-06 LAB — HIV ANTIBODY (ROUTINE TESTING W REFLEX): HIV Screen 4th Generation wRfx: NONREACTIVE

## 2021-01-06 LAB — RPR: RPR Ser Ql: NONREACTIVE

## 2021-02-02 ENCOUNTER — Ambulatory Visit (INDEPENDENT_AMBULATORY_CARE_PROVIDER_SITE_OTHER): Payer: Medicaid Other | Admitting: Women's Health

## 2021-02-02 ENCOUNTER — Encounter: Payer: Self-pay | Admitting: Women's Health

## 2021-02-02 ENCOUNTER — Other Ambulatory Visit: Payer: Self-pay

## 2021-02-02 VITALS — BP 108/64 | HR 93 | Wt 170.5 lb

## 2021-02-02 DIAGNOSIS — Z3403 Encounter for supervision of normal first pregnancy, third trimester: Secondary | ICD-10-CM | POA: Diagnosis not present

## 2021-02-02 DIAGNOSIS — Z3A31 31 weeks gestation of pregnancy: Secondary | ICD-10-CM

## 2021-02-02 DIAGNOSIS — Z23 Encounter for immunization: Secondary | ICD-10-CM | POA: Diagnosis not present

## 2021-02-02 NOTE — Progress Notes (Signed)
    LOW-RISK PREGNANCY VISIT Patient name: Heather Hendrix MRN [id number]  Date of birth: 1995-08-07 Chief Complaint:   Routine Prenatal Visit  History of Present Illness:   Heather Hendrix is a 25 y.o. G1P0 female at [redacted]w[redacted]d with an Estimated Date of Delivery: 04/04/21 being seen today for ongoing management of a low-risk pregnancy.   Today she reports low back pain. Contractions: Irritability. Vag. Bleeding: None.  Movement: Present. denies leaking of fluid.  Depression screen [facility] 2/9 01/05/2021 10/13/2020  Decreased Interest 1 2  Down, Depressed, Hopeless 1 1  PHQ - 2 Score 2 3  Altered sleeping 1 2  Tired, decreased energy 2 3  Change in appetite 1 3  Feeling bad or failure about yourself  0 1  Trouble concentrating 1 0  Moving slowly or fidgety/restless 0 0  Suicidal thoughts 0 0  PHQ-9 Score 7 12     GAD 7 : Generalized Anxiety Score 01/05/2021 10/13/2020  Nervous, Anxious, on Edge 0 1  Control/stop worrying 0 0  Worry too much - different things 1 2  Trouble relaxing 1 0  Restless 0 0  Easily annoyed or irritable 2 2  Afraid - awful might happen 0 1  Total GAD 7 Score 4 6      Review of Systems:   Pertinent items are noted in HPI Denies abnormal vaginal discharge w/ itching/odor/irritation, headaches, visual changes, shortness of breath, chest pain, abdominal pain, severe nausea/vomiting, or problems with urination or bowel movements unless otherwise stated above. Pertinent History Reviewed:  Reviewed past medical,surgical, social, obstetrical and family history.  Reviewed problem list, medications and allergies. Physical Assessment:   Vitals:   02/02/21 1348  BP: 108/64  Pulse: 93  Weight: 170 lb 8 oz (77.3 kg)  Body mass index is 27.52 kg/m.        Physical Examination:   General appearance: Well appearing, and in no distress  Mental status: Alert, oriented to person, place, and time  Skin: Warm & dry  Cardiovascular: Normal heart rate  noted  Respiratory: Normal respiratory effort, no distress  Abdomen: Soft, gravid, nontender  Pelvic: Cervical exam deferred         Extremities: Edema: Trace  Fetal Status: Fetal Heart Rate (bpm): 120 Fundal Height: 29 cm Movement: Present    Chaperone: N/A   No results found for this or any previous visit (from the past 24 hour(s)).  Assessment & Plan:  1) Low-risk pregnancy G1P0 at [redacted]w[redacted]d with an Estimated Date of Delivery: 04/04/21   2) Low back pain> using ktape, helps   Meds: No orders of the defined types were placed in this encounter.  Labs/procedures today: tdap  Plan:  Continue routine obstetrical care  Next visit: prefers in person    Reviewed: Preterm labor symptoms and general obstetric precautions including but not limited to vaginal bleeding, contractions, leaking of fluid and fetal movement were reviewed in detail with the patient.  All questions were answered. Does have home bp cuff. Office bp cuff given: not applicable. Check bp weekly, let us know if consistently >140 and/or >90.  Follow-up: Return in about 2 weeks (around 02/16/2021) for LROB, CNM, in person.  No future appointments.  Orders Placed This Encounter  Procedures   Tdap vaccine greater than or equal to 7yo IM   Cheral Marker CNM, [facility] 02/02/2021 2:10 PM

## 2021-02-02 NOTE — Patient Instructions (Signed)
Heather Hendrix, thank you for choosing our office today! We appreciate the opportunity to meet your healthcare needs. You may receive a short survey by mail, e-mail, or through MyChart. If you are happy with your care we would appreciate if you could take just a few minutes to complete the survey questions. We read all of your comments and take your feedback very seriously. Thank you again for choosing our office.  [facility]  [facility] ([address] [place], [place] [address]) Entrance C, located off of [address] Free 24/7 valet parking   CLASSES: Go to [email or web address] to register for classes (childbirth, breastfeeding, waterbirth, infant CPR, daddy bootcamp, etc.)  Call the office ([phone]) or go to [facility] if: You begin to have strong, frequent contractions Your water breaks.  Sometimes it is a big gush of fluid, sometimes it is just a trickle that keeps getting your panties wet or running down your legs You have vaginal bleeding.  It is normal to have a small amount of spotting if your cervix was checked.  You don't feel your baby moving like normal.  If you don't, get you something to eat and drink and lay down and focus on feeling your baby move.   If your baby is still not moving like normal, you should call the office or go to [facility].  Call the office ([phone]) or go to [facility] for these signs of pre-eclampsia: Severe headache that does not go away with Tylenol Visual changes- seeing spots, double, blurred vision Pain under your right breast or upper abdomen that does not go away with Tums or heartburn medicine Nausea and/or vomiting Severe swelling in your hands, feet, and face   Tdap Vaccine It is recommended that you get the Tdap vaccine during the third trimester of EACH pregnancy to help protect your baby from getting pertussis (whooping cough) 27-36 weeks is the BEST time to do  this so that you can pass the protection on to your baby. During pregnancy is better than after pregnancy, but if you are unable to get it during pregnancy it will be offered at the hospital.  You can get this vaccine with us, at the health department, your family doctor, or some local pharmacies Everyone who will be around your baby should also be up-to-date on their vaccines before the baby comes. Adults (who are not pregnant) only need 1 dose of Tdap during adulthood.   [place] Pediatricians/Family Doctors [facility] ([facility]): [address], [phone]           [facility]: [address], [phone]                [facility] ([facility]): [address], [phone] (call to ask if accepting patients) [facility]: [address], [place], [phone]    [place] Pediatricians/Family Doctors [facility] ([facility]): [address], [phone] [facility]: [address], [phone] [facility]: [address], [phone]  [place] [facility]  [facility] ([facility]): [phone] [facility]: [address], [phone]   [place] Family Doctors [facility]: [address], [phone]  [place] [facility]  [facility]: [address], [phone]  Home Blood Pressure Monitoring for Patients   Your provider has recommended that you check your   blood pressure (BP) at least once a week at home. If you do not have a blood pressure cuff at home, one will be provided for you. Contact your provider if you have not received your monitor within 1 week.   Helpful Tips for Accurate Home Blood Pressure Checks  Don't smoke, exercise, or drink caffeine 30 minutes before checking your BP Use the restroom before checking your BP (a full bladder can raise your  pressure) Relax in a comfortable upright chair Feet on the ground Left arm resting comfortably on a flat surface at the level of your heart Legs uncrossed Back supported Sit quietly and don't talk Place the cuff on your bare arm Adjust snuggly, so that only two fingertips can fit between your skin and the top of the cuff Check 2 readings separated by at least one minute Keep a log of your BP readings For a visual, please reference this diagram: http://ccnc.care/bpdiagram  Provider Name: [facility]     Phone: [phone]  Zone 1: ALL CLEAR  Continue to monitor your symptoms:  BP reading is less than 140 (top number) or less than 90 (bottom number)  No right upper stomach pain No headaches or seeing spots No feeling nauseated or throwing up No swelling in face and hands  Zone 2: CAUTION Call your doctor's office for any of the following:  BP reading is greater than 140 (top number) or greater than 90 (bottom number)  Stomach pain under your ribs in the middle or right side Headaches or seeing spots Feeling nauseated or throwing up Swelling in face and hands  Zone 3: EMERGENCY  Seek immediate medical care if you have any of the following:  BP reading is greater than160 (top number) or greater than 110 (bottom number) Severe headaches not improving with Tylenol Serious difficulty catching your breath Any worsening symptoms from Zone 2  Preterm Labor and Birth Information  The normal length of a pregnancy is 39-41 weeks. Preterm labor is when labor starts before 37 completed weeks of pregnancy. What are the risk factors for preterm labor? Preterm labor is more likely to occur in women who: Have certain infections during pregnancy such as a bladder infection, sexually transmitted infection, or infection inside the uterus (chorioamnionitis). Have a shorter-than-normal cervix. Have gone into preterm labor before. Have had surgery on their cervix. Are younger than age 17  or older than age 35. Are African American. Are pregnant with twins or multiple babies (multiple gestation). Take street drugs or smoke while pregnant. Do not gain enough weight while pregnant. Became pregnant shortly after having been pregnant. What are the symptoms of preterm labor? Symptoms of preterm labor include: Cramps similar to those that can happen during a menstrual period. The cramps may happen with diarrhea. Pain in the abdomen or lower back. Regular uterine contractions that may feel like tightening of the abdomen. A feeling of increased pressure in the pelvis. Increased watery or bloody mucus discharge from the vagina. Water breaking (ruptured amniotic sac). Why is it important to recognize signs of preterm labor? It is important to recognize signs of preterm labor because babies who are born prematurely may not be fully developed. This can put them at an increased risk for: Long-term (chronic) heart and lung problems. Difficulty immediately after birth with regulating body systems, including blood sugar, body temperature, heart rate, and breathing rate. Bleeding in the brain. Cerebral palsy. Learning difficulties. Death. These risks are highest for babies who are born before 34 weeks   of pregnancy. How is preterm labor treated? Treatment depends on the length of your pregnancy, your condition, and the health of your baby. It may involve: Having a stitch (suture) placed in your cervix to prevent your cervix from opening too early (cerclage). Taking or being given medicines, such as: Hormone medicines. These may be given early in pregnancy to help support the pregnancy. Medicine to stop contractions. Medicines to help mature the baby's lungs. These may be prescribed if the risk of delivery is high. Medicines to prevent your baby from developing cerebral palsy. If the labor happens before 34 weeks of pregnancy, you may need to stay in the hospital. What should I do if I  think I am in preterm labor? If you think that you are going into preterm labor, call your health care provider right away. How can I prevent preterm labor in future pregnancies? To increase your chance of having a full-term pregnancy: Do not use any tobacco products, such as cigarettes, chewing tobacco, and e-cigarettes. If you need help quitting, ask your health care provider. Do not use street drugs or medicines that have not been prescribed to you during your pregnancy. Talk with your health care provider before taking any herbal supplements, even if you have been taking them regularly. Make sure you gain a healthy amount of weight during your pregnancy. Watch for infection. If you think that you might have an infection, get it checked right away. Make sure to tell your health care provider if you have gone into preterm labor before. This information is not intended to replace advice given to you by your health care provider. Make sure you discuss any questions you have with your health care provider. Document Revised: 06/30/2018 Document Reviewed: 07/30/2015 Elsevier Patient Education  2020 Elsevier Inc.   

## 2021-02-16 ENCOUNTER — Other Ambulatory Visit: Payer: Self-pay

## 2021-02-16 ENCOUNTER — Ambulatory Visit (INDEPENDENT_AMBULATORY_CARE_PROVIDER_SITE_OTHER): Payer: Medicaid Other | Admitting: Women's Health

## 2021-02-16 ENCOUNTER — Encounter: Payer: Self-pay | Admitting: Women's Health

## 2021-02-16 VITALS — BP 107/58 | HR 100 | Wt 167.0 lb

## 2021-02-16 DIAGNOSIS — Z3A33 33 weeks gestation of pregnancy: Secondary | ICD-10-CM

## 2021-02-16 DIAGNOSIS — Z3403 Encounter for supervision of normal first pregnancy, third trimester: Secondary | ICD-10-CM

## 2021-02-16 DIAGNOSIS — B009 Herpesviral infection, unspecified: Secondary | ICD-10-CM | POA: Insufficient documentation

## 2021-02-16 MED ORDER — ACYCLOVIR 400 MG PO TABS: 400.0000 mg | ORAL_TABLET | Freq: Three times a day (TID) | ORAL | 3 refills | Status: AC

## 2021-02-16 NOTE — Progress Notes (Signed)
LOW-RISK PREGNANCY VISIT Patient name: Heather Hendrix MRN [id number]  Date of birth: 1995-08-13 Chief Complaint:   Routine Prenatal Visit  History of Present Illness:   Heather Hendrix is a 25 y.o. G1P0 female at [redacted]w[redacted]d with an Estimated Date of Delivery: 04/04/21 being seen today for ongoing management of a low-risk pregnancy.   Today she reports no complaints. Contractions: Irritability. Vag. Bleeding: None.  Movement: Present. denies leaking of fluid.  Depression screen [facility] 2/9 01/05/2021 10/13/2020  Decreased Interest 1 2  Down, Depressed, Hopeless 1 1  PHQ - 2 Score 2 3  Altered sleeping 1 2  Tired, decreased energy 2 3  Change in appetite 1 3  Feeling bad or failure about yourself  0 1  Trouble concentrating 1 0  Moving slowly or fidgety/restless 0 0  Suicidal thoughts 0 0  PHQ-9 Score 7 12     GAD 7 : Generalized Anxiety Score 01/05/2021 10/13/2020  Nervous, Anxious, on Edge 0 1  Control/stop worrying 0 0  Worry too much - different things 1 2  Trouble relaxing 1 0  Restless 0 0  Easily annoyed or irritable 2 2  Afraid - awful might happen 0 1  Total GAD 7 Score 4 6      Review of Systems:   Pertinent items are noted in HPI Denies abnormal vaginal discharge w/ itching/odor/irritation, headaches, visual changes, shortness of breath, chest pain, abdominal pain, severe nausea/vomiting, or problems with urination or bowel movements unless otherwise stated above. Pertinent History Reviewed:  Reviewed past medical,surgical, social, obstetrical and family history.  Reviewed problem list, medications and allergies. Physical Assessment:   Vitals:   02/16/21 1405  BP: (!) 107/58  Pulse: 100  Weight: 167 lb (75.8 kg)  Body mass index is 26.95 kg/m.        Physical Examination:   General appearance: Well appearing, and in no distress  Mental status: Alert, oriented to person, place, and time  Skin: Warm & dry  Cardiovascular: Normal heart rate  noted  Respiratory: Normal respiratory effort, no distress  Abdomen: Soft, gravid, nontender  Pelvic: Cervical exam deferred         Extremities: Edema: None  Fetal Status: Fetal Heart Rate (bpm): 140 Fundal Height: 31 cm Movement: Present    Chaperone: N/A   No results found for this or any previous visit (from the past 24 hour(s)).  Assessment & Plan:  1) Low-risk pregnancy G1P0 at [redacted]w[redacted]d with an Estimated Date of Delivery: 04/04/21   2) HSV, rx acyclovir for suppression   Meds:  Meds ordered this encounter  Medications   acyclovir (ZOVIRAX) 400 MG tablet    Sig: Take 1 tablet (400 mg total) by mouth 3 (three) times daily.    Dispense:  90 tablet    Refill:  3    Order Specific Question:   Supervising Provider    Answer:   Lazaro Arms [[id number]]   Labs/procedures today: none  Plan:  Continue routine obstetrical care  Next visit: prefers in person    Reviewed: Preterm labor symptoms and general obstetric precautions including but not limited to vaginal bleeding, contractions, leaking of fluid and fetal movement were reviewed in detail with the patient.  All questions were answered. Does have home bp cuff. Office bp cuff given: not applicable. Check bp weekly, let us know if consistently >140 and/or >90.  Follow-up: Return in about 2 weeks (around 03/02/2021) for LROB, CNM, in person.  No future appointments.  No  orders of the defined types were placed in this encounter.  Cheral Marker CNM, [facility] 02/16/2021 2:22 PM

## 2021-02-16 NOTE — Patient Instructions (Signed)
Amzie, thank you for choosing our office today! We appreciate the opportunity to meet your healthcare needs. You may receive a short survey by mail, e-mail, or through MyChart. If you are happy with your care we would appreciate if you could take just a few minutes to complete the survey questions. We read all of your comments and take your feedback very seriously. Thank you again for choosing our office.  [facility]  [facility] ([address] [place], [place] [address]) Entrance C, located off of [address] Free 24/7 valet parking   CLASSES: Go to [email or web address] to register for classes (childbirth, breastfeeding, waterbirth, infant CPR, daddy bootcamp, etc.)  Call the office ([phone]) or go to [facility] if: You begin to have strong, frequent contractions Your water breaks.  Sometimes it is a big gush of fluid, sometimes it is just a trickle that keeps getting your panties wet or running down your legs You have vaginal bleeding.  It is normal to have a small amount of spotting if your cervix was checked.  You don't feel your baby moving like normal.  If you don't, get you something to eat and drink and lay down and focus on feeling your baby move.   If your baby is still not moving like normal, you should call the office or go to [facility].  Call the office ([phone]) or [facility] to [facility] for these signs of pre-eclampsia: Severe headache that does not go away with Tylenol Visual changes- seeing spots, double, blurred vision Pain under your right breast or upper abdomen that does not go away with Tums or heartburn medicine Nausea and/or vomiting Severe swelling in your hands, feet, and face   Tdap Vaccine It is recommended that you get the Tdap vaccine during the third trimester of EACH pregnancy to help protect your baby from getting pertussis (whooping cough) 27-36 weeks is the BEST time to do  this so that you can pass the protection on to your baby. During pregnancy is better than after pregnancy, but if you are unable to get it during pregnancy it will be offered at the hospital.  You can get this vaccine with us, at the health department, your family doctor, or some local pharmacies Everyone who will be around your baby should also be up-to-date on their vaccines before the baby comes. Adults (who are not pregnant) only need 1 dose of Tdap during adulthood.   [place] Pediatricians/Family Doctors [facility] ([facility]): [address], [phone]           [facility]: [address], [phone]                [facility] ([facility]): [address], [phone] (call to ask if accepting patients) [facility]: [address], [place], [phone]    [place] Pediatricians/Family Doctors [facility] ([facility]): [address], [phone] [facility]: [address], [phone] [facility]: [address], [phone]  [place] [facility]  [facility] ([facility]): [phone] [facility]: [address], [phone]   [place] [facility]: [address], [phone]  [place] [facility]  [facility]: [address], [phone]  Home Blood Pressure Monitoring for Patients   Your provider has recommended that you check your   blood pressure (BP) at least once a week at home. If you do not have a blood pressure cuff at home, one will be provided for you. Contact your provider if you have not received your monitor within 1 week.   Helpful Tips for Accurate Home Blood Pressure Checks  Don't smoke, exercise, or drink caffeine 30 minutes before checking your BP Use the restroom before checking your BP (a full bladder can raise your  pressure) Relax in a comfortable upright chair Feet on the ground Left arm resting comfortably on a flat surface at the level of your heart Legs uncrossed Back supported Sit quietly and don't talk Place the cuff on your bare arm Adjust snuggly, so that only two fingertips can fit between your skin and the top of the cuff Check 2 readings separated by at least one minute Keep a log of your BP readings For a visual, please reference this diagram: http://ccnc.care/bpdiagram  Provider Name: [facility]     Phone: [phone]  Zone 1: ALL CLEAR  Continue to monitor your symptoms:  BP reading is less than 140 (top number) or less than 90 (bottom number)  No right upper stomach pain No headaches or seeing spots No feeling nauseated or throwing up No swelling in face and hands  Zone 2: CAUTION Call your doctor's office for any of the following:  BP reading is greater than 140 (top number) or greater than 90 (bottom number)  Stomach pain under your ribs in the middle or right side Headaches or seeing spots Feeling nauseated or throwing up Swelling in face and hands  Zone 3: EMERGENCY  Seek immediate medical care if you have any of the following:  BP reading is greater than160 (top number) or greater than 110 (bottom number) Severe headaches not improving with Tylenol Serious difficulty catching your breath Any worsening symptoms from Zone 2  Preterm Labor and Birth Information  The normal length of a pregnancy is 39-41 weeks. Preterm labor is when labor starts before 37 completed weeks of pregnancy. What are the risk factors for preterm labor? Preterm labor is more likely to occur in women who: Have certain infections during pregnancy such as a bladder infection, sexually transmitted infection, or infection inside the uterus (chorioamnionitis). Have a shorter-than-normal cervix. Have gone into preterm labor before. Have had surgery on their cervix. Are younger than age 17  or older than age 35. Are African American. Are pregnant with twins or multiple babies (multiple gestation). Take street drugs or smoke while pregnant. Do not gain enough weight while pregnant. Became pregnant shortly after having been pregnant. What are the symptoms of preterm labor? Symptoms of preterm labor include: Cramps similar to those that can happen during a menstrual period. The cramps may happen with diarrhea. Pain in the abdomen or lower back. Regular uterine contractions that may feel like tightening of the abdomen. A feeling of increased pressure in the pelvis. Increased watery or bloody mucus discharge from the vagina. Water breaking (ruptured amniotic sac). Why is it important to recognize signs of preterm labor? It is important to recognize signs of preterm labor because babies who are born prematurely may not be fully developed. This can put them at an increased risk for: Long-term (chronic) heart and lung problems. Difficulty immediately after birth with regulating body systems, including blood sugar, body temperature, heart rate, and breathing rate. Bleeding in the brain. Cerebral palsy. Learning difficulties. Death. These risks are highest for babies who are born before 34 weeks   of pregnancy. How is preterm labor treated? Treatment depends on the length of your pregnancy, your condition, and the health of your baby. It may involve: Having a stitch (suture) placed in your cervix to prevent your cervix from opening too early (cerclage). Taking or being given medicines, such as: Hormone medicines. These may be given early in pregnancy to help support the pregnancy. Medicine to stop contractions. Medicines to help mature the baby's lungs. These may be prescribed if the risk of delivery is high. Medicines to prevent your baby from developing cerebral palsy. If the labor happens before 34 weeks of pregnancy, you may need to stay in the hospital. What should I do if I  think I am in preterm labor? If you think that you are going into preterm labor, call your health care provider right away. How can I prevent preterm labor in future pregnancies? To increase your chance of having a full-term pregnancy: Do not use any tobacco products, such as cigarettes, chewing tobacco, and e-cigarettes. If you need help quitting, ask your health care provider. Do not use street drugs or medicines that have not been prescribed to you during your pregnancy. Talk with your health care provider before taking any herbal supplements, even if you have been taking them regularly. Make sure you gain a healthy amount of weight during your pregnancy. Watch for infection. If you think that you might have an infection, get it checked right away. Make sure to tell your health care provider if you have gone into preterm labor before. This information is not intended to replace advice given to you by your health care provider. Make sure you discuss any questions you have with your health care provider. Document Revised: 06/30/2018 Document Reviewed: 07/30/2015 Elsevier Patient Education  2020 Elsevier Inc.   

## 2021-03-02 ENCOUNTER — Other Ambulatory Visit: Payer: Self-pay

## 2021-03-02 ENCOUNTER — Encounter: Payer: Self-pay | Admitting: Women's Health

## 2021-03-02 ENCOUNTER — Ambulatory Visit (INDEPENDENT_AMBULATORY_CARE_PROVIDER_SITE_OTHER): Payer: Medicaid Other | Admitting: Women's Health

## 2021-03-02 VITALS — BP 107/63 | HR 81 | Wt 171.5 lb

## 2021-03-02 DIAGNOSIS — Z3403 Encounter for supervision of normal first pregnancy, third trimester: Secondary | ICD-10-CM

## 2021-03-02 NOTE — Patient Instructions (Signed)
Brie, thank you for choosing our office today! We appreciate the opportunity to meet your healthcare needs. You may receive a short survey by mail, e-mail, or through MyChart. If you are happy with your care we would appreciate if you could take just a few minutes to complete the survey questions. We read all of your comments and take your feedback very seriously. Thank you again for choosing our office.  [facility] Team at [facility] at  ([address] [place], [place] [address]) Entrance C, located off of [address] Free 24/7 valet parking   CLASSES: Go to [email or web address] to register for classes (childbirth, breastfeeding, waterbirth, infant CPR, daddy bootcamp, etc.)  Call the office ([phone]) or go to [facility] if: You begin to have strong, frequent contractions Your water breaks.  Sometimes it is a big gush of fluid, sometimes it is just a trickle that keeps getting your panties wet or running down your legs You have vaginal bleeding.  It is normal to have a small amount of spotting if your cervix was checked.  You don't feel your baby moving like normal.  If you don't, get you something to eat and drink and lay down and focus on feeling your baby move.   If your baby is still not moving like normal, you should call the office or go to [facility].  Call the office ([phone]) or go to [facility] for these signs of pre-eclampsia: Severe headache that does not go away with Tylenol Visual changes- seeing spots, double, blurred vision Pain under your right breast or upper abdomen that does not go away with Tums or heartburn medicine Nausea and/or vomiting Severe swelling in your hands, feet, and face   Tdap Vaccine It is recommended that you get the Tdap vaccine during the third trimester of EACH pregnancy to help protect your baby from getting pertussis (whooping cough) 27-36 weeks is the BEST time to do  this so that you can pass the protection on to your baby. During pregnancy is better than after pregnancy, but if you are unable to get it during pregnancy it will be offered at the hospital.  You can get this vaccine with us, at the health department, your family doctor, or some local pharmacies Everyone who will be around your baby should also be up-to-date on their vaccines before the baby comes. Adults (who are not pregnant) only need 1 dose of Tdap during adulthood.   [place] Pediatricians/Family Doctors [facility] ([facility]): [address], [phone]           [facility]: [address], [phone]                [facility] ([facility]): [address], [phone] (call to ask if accepting patients) [facility]: [address], [place], [phone]    [place] Pediatricians/Family Doctors [facility] ([facility]): [address], [phone] [facility]: [address], [phone] [facility]: [address], [phone]  [place] [facility]  [facility] ([facility]): [phone] [facility]: [address], [phone]   [place] Family Doctors [facility]: [address], [phone]  [place] [facility]  [facility]: [address], [phone]  Home Blood Pressure Monitoring for Patients   Your provider has recommended that you check your   blood pressure (BP) at least once a week at home. If you do not have a blood pressure cuff at home, one will be provided for you. Contact your provider if you have not received your monitor within 1 week.   Helpful Tips for Accurate Home Blood Pressure Checks  Don't smoke, exercise, or drink caffeine 30 minutes before checking your BP Use the restroom before checking your BP (a full bladder can raise your  pressure) Relax in a comfortable upright chair Feet on the ground Left arm resting comfortably on a flat surface at the level of your heart Legs uncrossed Back supported Sit quietly and don't talk Place the cuff on your bare arm Adjust snuggly, so that only two fingertips can fit between your skin and the top of the cuff Check 2 readings separated by at least one minute Keep a log of your BP readings For a visual, please reference this diagram: http://ccnc.care/bpdiagram  Provider Name: [facility]     Phone: [phone]  Zone 1: ALL CLEAR  Continue to monitor your symptoms:  BP reading is less than 140 (top number) or less than 90 (bottom number)  No right upper stomach pain No headaches or seeing spots No feeling nauseated or throwing up No swelling in face and hands  Zone 2: CAUTION Call your doctor's office for any of the following:  BP reading is greater than 140 (top number) or greater than 90 (bottom number)  Stomach pain under your ribs in the middle or right side Headaches or seeing spots Feeling nauseated or throwing up Swelling in face and hands  Zone 3: EMERGENCY  Seek immediate medical care if you have any of the following:  BP reading is greater than160 (top number) or greater than 110 (bottom number) Severe headaches not improving with Tylenol Serious difficulty catching your breath Any worsening symptoms from Zone 2  Preterm Labor and Birth Information  The normal length of a pregnancy is 39-41 weeks. Preterm labor is when labor starts before 37 completed weeks of pregnancy. What are the risk factors for preterm labor? Preterm labor is more likely to occur in women who: Have certain infections during pregnancy such as a bladder infection, sexually transmitted infection, or infection inside the uterus (chorioamnionitis). Have a shorter-than-normal cervix. Have gone into preterm labor before. Have had surgery on their cervix. Are younger than age 17  or older than age 35. Are African American. Are pregnant with twins or multiple babies (multiple gestation). Take street drugs or smoke while pregnant. Do not gain enough weight while pregnant. Became pregnant shortly after having been pregnant. What are the symptoms of preterm labor? Symptoms of preterm labor include: Cramps similar to those that can happen during a menstrual period. The cramps may happen with diarrhea. Pain in the abdomen or lower back. Regular uterine contractions that may feel like tightening of the abdomen. A feeling of increased pressure in the pelvis. Increased watery or bloody mucus discharge from the vagina. Water breaking (ruptured amniotic sac). Why is it important to recognize signs of preterm labor? It is important to recognize signs of preterm labor because babies who are born prematurely may not be fully developed. This can put them at an increased risk for: Long-term (chronic) heart and lung problems. Difficulty immediately after birth with regulating body systems, including blood sugar, body temperature, heart rate, and breathing rate. Bleeding in the brain. Cerebral palsy. Learning difficulties. Death. These risks are highest for babies who are born before 34 weeks   of pregnancy. How is preterm labor treated? Treatment depends on the length of your pregnancy, your condition, and the health of your baby. It may involve: Having a stitch (suture) placed in your cervix to prevent your cervix from opening too early (cerclage). Taking or being given medicines, such as: Hormone medicines. These may be given early in pregnancy to help support the pregnancy. Medicine to stop contractions. Medicines to help mature the baby's lungs. These may be prescribed if the risk of delivery is high. Medicines to prevent your baby from developing cerebral palsy. If the labor happens before 34 weeks of pregnancy, you may need to stay in the hospital. What should I do if I  think I am in preterm labor? If you think that you are going into preterm labor, call your health care provider right away. How can I prevent preterm labor in future pregnancies? To increase your chance of having a full-term pregnancy: Do not use any tobacco products, such as cigarettes, chewing tobacco, and e-cigarettes. If you need help quitting, ask your health care provider. Do not use street drugs or medicines that have not been prescribed to you during your pregnancy. Talk with your health care provider before taking any herbal supplements, even if you have been taking them regularly. Make sure you gain a healthy amount of weight during your pregnancy. Watch for infection. If you think that you might have an infection, get it checked right away. Make sure to tell your health care provider if you have gone into preterm labor before. This information is not intended to replace advice given to you by your health care provider. Make sure you discuss any questions you have with your health care provider. Document Revised: 06/30/2018 Document Reviewed: 07/30/2015 Elsevier Patient Education  2020 Elsevier Inc.   

## 2021-03-02 NOTE — Progress Notes (Signed)
    LOW-RISK PREGNANCY VISIT Patient name: Heather Hendrix MRN [id number]  Date of birth: [birth year] Chief Complaint:   Routine Prenatal Visit  History of Present Illness:   Heather Hendrix is a 25 y.o. G1P0 female at [redacted]w[redacted]d with an Estimated Date of Delivery: 04/04/21 being seen today for ongoing management of a low-risk pregnancy.   Today she reports no complaints. Contractions: Irritability. Vag. Bleeding: None.  Movement: Present. denies leaking of fluid.  Depression screen [facility] 2/9 01/05/2021 10/13/2020  Decreased Interest 1 2  Down, Depressed, Hopeless 1 1  PHQ - 2 Score 2 3  Altered sleeping 1 2  Tired, decreased energy 2 3  Change in appetite 1 3  Feeling bad or failure about yourself  0 1  Trouble concentrating 1 0  Moving slowly or fidgety/restless 0 0  Suicidal thoughts 0 0  PHQ-9 Score 7 12     GAD 7 : Generalized Anxiety Score 01/05/2021 10/13/2020  Nervous, Anxious, on Edge 0 1  Control/stop worrying 0 0  Worry too much - different things 1 2  Trouble relaxing 1 0  Restless 0 0  Easily annoyed or irritable 2 2  Afraid - awful might happen 0 1  Total GAD 7 Score 4 6      Review of Systems:   Pertinent items are noted in HPI Denies abnormal vaginal discharge w/ itching/odor/irritation, headaches, visual changes, shortness of breath, chest pain, abdominal pain, severe nausea/vomiting, or problems with urination or bowel movements unless otherwise stated above. Pertinent History Reviewed:  Reviewed past medical,surgical, social, obstetrical and family history.  Reviewed problem list, medications and allergies. Physical Assessment:   Vitals:   03/02/21 1432  BP: 107/63  Pulse: 81  Weight: 171 lb 8 oz (77.8 kg)  Body mass index is 27.68 kg/m.        Physical Examination:   General appearance: Well appearing, and in no distress  Mental status: Alert, oriented to person, place, and time  Skin: Warm & dry  Cardiovascular: Normal heart rate  noted  Respiratory: Normal respiratory effort, no distress  Abdomen: Soft, gravid, nontender  Pelvic: Cervical exam deferred         Extremities: Edema: Trace  Fetal Status: Fetal Heart Rate (bpm): 140 Fundal Height: 33 cm Movement: Present    Chaperone: N/A   No results found for this or any previous visit (from the past 24 hour(s)).  Assessment & Plan:  1) Low-risk pregnancy G1P0 at [redacted]w[redacted]d with an Estimated Date of Delivery: 04/04/21    Meds: No orders of the defined types were placed in this encounter.  Labs/procedures today: none  Plan:  Continue routine obstetrical care  Next visit: prefers will be in person for cul     Reviewed: Preterm labor symptoms and general obstetric precautions including but not limited to vaginal bleeding, contractions, leaking of fluid and fetal movement were reviewed in detail with the patient.  All questions were answered. Does have home bp cuff. Office bp cuff given: not applicable. Check bp weekly, let us know if consistently >140 and/or >90.  Follow-up: Return for weekly, CNM, in person.  No future appointments.  No orders of the defined types were placed in this encounter.  Cheral Marker CNM, [facility] 03/02/2021 3:02 PM

## 2021-03-09 ENCOUNTER — Other Ambulatory Visit: Payer: Self-pay

## 2021-03-09 ENCOUNTER — Other Ambulatory Visit (HOSPITAL_COMMUNITY)
Admission: RE | Admit: 2021-03-09 | Discharge: 2021-03-09 | Disposition: A | Discharge status: Home / Self Care | Payer: Medicaid Other | Source: Ambulatory Visit | Attending: Women's Health | Admitting: Women's Health

## 2021-03-09 ENCOUNTER — Ambulatory Visit (INDEPENDENT_AMBULATORY_CARE_PROVIDER_SITE_OTHER): Payer: Medicaid Other | Admitting: Women's Health

## 2021-03-09 ENCOUNTER — Encounter: Payer: Self-pay | Admitting: Women's Health

## 2021-03-09 VITALS — BP 131/77 | HR 111 | Wt 173.4 lb

## 2021-03-09 DIAGNOSIS — Z3403 Encounter for supervision of normal first pregnancy, third trimester: Secondary | ICD-10-CM | POA: Diagnosis present

## 2021-03-09 DIAGNOSIS — Z3A36 36 weeks gestation of pregnancy: Secondary | ICD-10-CM | POA: Insufficient documentation

## 2021-03-09 NOTE — Progress Notes (Signed)
LOW-RISK PREGNANCY VISIT Patient name: Heather Hendrix MRN [id number]  Date of birth: 07-01-1995 Chief Complaint:   Routine Prenatal Visit  History of Present Illness:   Heather Hendrix is a 25 y.o. G1P0 female at [redacted]w[redacted]d with an Estimated Date of Delivery: 04/04/21 being seen today for ongoing management of a low-risk pregnancy.   Today she reports no complaints. Contractions: Irritability. Vag. Bleeding: None.  Movement: Present. denies leaking of fluid.  Depression screen [facility] 2/9 01/05/2021 10/13/2020  Decreased Interest 1 2  Down, Depressed, Hopeless 1 1  PHQ - 2 Score 2 3  Altered sleeping 1 2  Tired, decreased energy 2 3  Change in appetite 1 3  Feeling bad or failure about yourself  0 1  Trouble concentrating 1 0  Moving slowly or fidgety/restless 0 0  Suicidal thoughts 0 0  PHQ-9 Score 7 12     GAD 7 : Generalized Anxiety Score 01/05/2021 10/13/2020  Nervous, Anxious, on Edge 0 1  Control/stop worrying 0 0  Worry too much - different things 1 2  Trouble relaxing 1 0  Restless 0 0  Easily annoyed or irritable 2 2  Afraid - awful might happen 0 1  Total GAD 7 Score 4 6      Review of Systems:   Pertinent items are noted in HPI Denies abnormal vaginal discharge w/ itching/odor/irritation, headaches, visual changes, shortness of breath, chest pain, abdominal pain, severe nausea/vomiting, or problems with urination or bowel movements unless otherwise stated above. Pertinent History Reviewed:  Reviewed past medical,surgical, social, obstetrical and family history.  Reviewed problem list, medications and allergies. Physical Assessment:   Vitals:   03/09/21 0905  BP: 131/77  Pulse: (!) 111  Weight: 173 lb 6.4 oz (78.7 kg)  Body mass index is 27.99 kg/m.        Physical Examination:   General appearance: Well appearing, and in no distress  Mental status: Alert, oriented to person, place, and time  Skin: Warm & dry  Cardiovascular: Normal heart rate  noted  Respiratory: Normal respiratory effort, no distress  Abdomen: Soft, gravid, nontender  Pelvic: Cervical exam performed  Dilation: 1.5 Effacement (%): 80 Station: -2  Extremities: Edema: Trace  Fetal Status: Fetal Heart Rate (bpm): 135 Fundal Height: 34 cm Movement: Present Presentation: Vertex  Chaperone: Angel Neas   No results found for this or any previous visit (from the past 24 hour(s)).  Assessment & Plan:  1) Low-risk pregnancy G1P0 at [redacted]w[redacted]d with an Estimated Date of Delivery: 04/04/21   Meds: No orders of the defined types were placed in this encounter.  Labs/procedures today: GBS, GC/CT, and SVE  Plan:  Continue routine obstetrical care  Next visit: prefers in person    Reviewed: Preterm labor symptoms and general obstetric precautions including but not limited to vaginal bleeding, contractions, leaking of fluid and fetal movement were reviewed in detail with the patient.  All questions were answered. Does have home bp cuff. Office bp cuff given: not applicable. Check bp weekly, let us know if consistently >140 and/or >90.  Follow-up: Return for weekly, CNM, in person.  Future Appointments  Date Time Provider Department Center  03/17/2021  8:30 AM Myna Hidalgo, DO [facility]  03/24/2021  9:30 AM Cheral Marker, CNM [facility]  03/30/2021  9:50 AM Cheral Marker, CNM [facility]    Orders Placed This Encounter  Procedures   Strep Gp B NAA   Cheral Marker CNM, [facility] 03/09/2021 9:37 AM

## 2021-03-09 NOTE — Patient Instructions (Signed)
Storey, thank you for choosing our office today! We appreciate the opportunity to meet your healthcare needs. You may receive a short survey by mail, e-mail, or through MyChart. If you are happy with your care we would appreciate if you could take just a few minutes to complete the survey questions. We read all of your comments and take your feedback very seriously. Thank you again for choosing our office.  [facility]  [facility] ([address] [place], [place] [address]) Entrance C, located off of [address] Free 24/7 valet parking   CLASSES: Go to [email or web address] to register for classes (childbirth, breastfeeding, waterbirth, infant CPR, daddy bootcamp, etc.)  Call the office ([phone]) or go to [facility] if: You begin to have strong, frequent contractions Your water breaks.  Sometimes it is a big gush of fluid, sometimes it is just a trickle that keeps getting your panties wet or running down your legs You have vaginal bleeding.  It is normal to have a small amount of spotting if your cervix was checked.  You don't feel your baby moving like normal.  If you don't, get you something to eat and drink and lay down and focus on feeling your baby move.   If your baby is still not moving like normal, you should call the office or go to [facility].  Call the office ([phone]) or [facility] to [facility] for these signs of pre-eclampsia: Severe headache that does not go away with Tylenol Visual changes- seeing spots, double, blurred vision Pain under your right breast or upper abdomen that does not go away with Tums or heartburn medicine Nausea and/or vomiting Severe swelling in your hands, feet, and face   Tdap Vaccine It is recommended that you get the Tdap vaccine during the third trimester of EACH pregnancy to help protect your baby from getting pertussis (whooping cough) 27-36 weeks is the BEST time to do  this so that you can pass the protection on to your baby. During pregnancy is better than after pregnancy, but if you are unable to get it during pregnancy it will be offered at the hospital.  You can get this vaccine with us, at the health department, your family doctor, or some local pharmacies Everyone who will be around your baby should also be up-to-date on their vaccines before the baby comes. Adults (who are not pregnant) only need 1 dose of Tdap during adulthood.   [place] Pediatricians/Family Doctors [facility] ([facility]): [address], [phone]           [facility]: [address], [phone]                [facility] ([facility]): [address], [phone] (call to ask if accepting patients) [facility]: [address], [place], [phone]    [place] Pediatricians/Family Doctors [facility] ([facility]): [address], [phone] [facility]: [address], [phone] [facility]: [address], [phone]  [place] [facility]  [facility] ([facility]): [phone] [facility]: [address], [phone]   [place] Family Doctors [facility]: [address], [phone]  [place] [facility]  [facility]: [address], [phone]  Home Blood Pressure Monitoring for Patients   Your provider has recommended that you check your   blood pressure (BP) at least once a week at home. If you do not have a blood pressure cuff at home, one will be provided for you. Contact your provider if you have not received your monitor within 1 week.  ° °Helpful Tips for Accurate Home Blood Pressure Checks  °Don't smoke, exercise, or drink caffeine 30 minutes before checking your BP °Use the restroom before checking your BP (a full bladder can raise your  pressure) °Relax in a comfortable upright chair °Feet on the ground °Left arm resting comfortably on a flat surface at the level of your heart °Legs uncrossed °Back supported °Sit quietly and don't talk °Place the cuff on your bare arm °Adjust snuggly, so that only two fingertips can fit between your skin and the top of the cuff °Check 2 readings separated by at least one minute °Keep a log of your BP readings °For a visual, please reference this diagram: http://ccnc.care/bpdiagram ° °Provider Name: [facility]     Phone: [phone] ° °Zone 1: ALL CLEAR  °Continue to monitor your symptoms:  °BP reading is less than 140 (top number) or less than 90 (bottom number)  °No right upper stomach pain °No headaches or seeing spots °No feeling nauseated or throwing up °No swelling in face and hands ° °Zone 2: CAUTION °Call your doctor's office for any of the following:  °BP reading is greater than 140 (top number) or greater than 90 (bottom number)  °Stomach pain under your ribs in the middle or right side °Headaches or seeing spots °Feeling nauseated or throwing up °Swelling in face and hands ° °Zone 3: EMERGENCY  °Seek immediate medical care if you have any of the following:  °BP reading is greater than160 (top number) or greater than 110 (bottom number) °Severe headaches not improving with Tylenol °Serious difficulty catching your breath °Any worsening symptoms from Zone 2  °Preterm Labor and Birth Information ° °The normal length of a pregnancy is 39-41 weeks. Preterm labor is when labor starts before 37 completed weeks of pregnancy. °What are the risk factors for preterm labor? °Preterm labor is more likely to occur in women who: °Have certain infections during pregnancy such as a bladder infection, sexually transmitted infection, or infection inside the uterus (chorioamnionitis). °Have a shorter-than-normal cervix. °Have gone into preterm labor before. °Have had surgery on their cervix. °Are younger than age 17  or older than age 35. °Are African American. °Are pregnant with twins or multiple babies (multiple gestation). °Take street drugs or smoke while pregnant. °Do not gain enough weight while pregnant. °Became pregnant shortly after having been pregnant. °What are the symptoms of preterm labor? °Symptoms of preterm labor include: °Cramps similar to those that can happen during a menstrual period. The cramps may happen with diarrhea. °Pain in the abdomen or lower back. °Regular uterine contractions that may feel like tightening of the abdomen. °A feeling of increased pressure in the pelvis. °Increased watery or bloody mucus discharge from the vagina. °Water breaking (ruptured amniotic sac). °Why is it important to recognize signs of preterm labor? °It is important to recognize signs of preterm labor because babies who are born prematurely may not be fully developed. This can put them at an increased risk for: °Long-term (chronic) heart and lung problems. °Difficulty immediately after birth with regulating body systems, including blood sugar, body temperature, heart rate, and breathing rate. °Bleeding in the brain. °Cerebral palsy. °Learning difficulties. °Death. °These risks are highest for babies who are born before 34 weeks   of pregnancy. How is preterm labor treated? Treatment depends on the length of your pregnancy, your condition, and the health of your baby. It may involve: Having a stitch (suture) placed in your cervix to prevent your cervix from opening too early (cerclage). Taking or being given medicines, such as: Hormone medicines. These may be given early in pregnancy to help support the pregnancy. Medicine to stop contractions. Medicines to help mature the babys lungs. These may be prescribed if the risk of delivery is high. Medicines to prevent your baby from developing cerebral palsy. If the labor happens before 34 weeks of pregnancy, you may need to stay in the hospital. What should I do if I  think I am in preterm labor? If you think that you are going into preterm labor, call your health care provider right away. How can I prevent preterm labor in future pregnancies? To increase your chance of having a full-term pregnancy: Do not use any tobacco products, such as cigarettes, chewing tobacco, and e-cigarettes. If you need help quitting, ask your health care provider. Do not use street drugs or medicines that have not been prescribed to you during your pregnancy. Talk with your health care provider before taking any herbal supplements, even if you have been taking them regularly. Make sure you gain a healthy amount of weight during your pregnancy. Watch for infection. If you think that you might have an infection, get it checked right away. Make sure to tell your health care provider if you have gone into preterm labor before. This information is not intended to replace advice given to you by your health care provider. Make sure you discuss any questions you have with your health care provider. Document Revised: 06/30/2018 Document Reviewed: 07/30/2015 Elsevier Patient Education  [place].

## 2021-03-10 LAB — CERVICOVAGINAL ANCILLARY ONLY
Chlamydia: NEGATIVE
Comment: NEGATIVE
Comment: NORMAL
Neisseria Gonorrhea: NEGATIVE

## 2021-03-11 LAB — STREP GP B NAA: Strep Gp B NAA: POSITIVE — AB

## 2021-03-17 ENCOUNTER — Other Ambulatory Visit: Payer: Self-pay

## 2021-03-17 ENCOUNTER — Ambulatory Visit (INDEPENDENT_AMBULATORY_CARE_PROVIDER_SITE_OTHER): Payer: Medicaid Other | Admitting: Obstetrics & Gynecology

## 2021-03-17 ENCOUNTER — Encounter: Payer: Self-pay | Admitting: Obstetrics & Gynecology

## 2021-03-17 VITALS — BP 131/62 | HR 88 | Wt 174.4 lb

## 2021-03-17 DIAGNOSIS — Z3403 Encounter for supervision of normal first pregnancy, third trimester: Secondary | ICD-10-CM

## 2021-03-17 NOTE — Progress Notes (Signed)
° °  LOW-RISK PREGNANCY VISIT Patient name: Heather Hendrix MRN [id number]  Date of birth: 1996/02/22 Chief Complaint:   Routine Prenatal Visit  History of Present Illness:   Heather Hendrix is a 25 y.o. G1P0 female at [redacted]w[redacted]d with an Estimated Date of Delivery: 04/04/21 being seen today for ongoing management of a low-risk pregnancy.   Depression screen [facility] 2/9 01/05/2021 10/13/2020  Decreased Interest 1 2  Down, Depressed, Hopeless 1 1  PHQ - 2 Score 2 3  Altered sleeping 1 2  Tired, decreased energy 2 3  Change in appetite 1 3  Feeling bad or failure about yourself  0 1  Trouble concentrating 1 0  Moving slowly or fidgety/restless 0 0  Suicidal thoughts 0 0  PHQ-9 Score 7 12    Today she reports no complaints. Contractions: Irritability. Vag. Bleeding: None.  Movement: Present. denies leaking of fluid. Review of Systems:   Pertinent items are noted in HPI Denies abnormal vaginal discharge w/ itching/odor/irritation, headaches, visual changes, shortness of breath, chest pain, abdominal pain, severe nausea/vomiting, or problems with urination or bowel movements unless otherwise stated above. Pertinent History Reviewed:  Reviewed past medical,surgical, social, obstetrical and family history.  Reviewed problem list, medications and allergies.  Physical Assessment:   Vitals:   03/17/21 0853  BP: 131/62  Pulse: 88  Weight: 174 lb 6.4 oz (79.1 kg)  Body mass index is 28.15 kg/m.        Physical Examination:   General appearance: Well appearing, and in no distress  Mental status: Alert, oriented to person, place, and time  Skin: Warm & dry  Respiratory: Normal respiratory effort, no distress  Abdomen: Soft, gravid, nontender  Pelvic: Cervical exam deferred         Extremities: Edema: Trace  Psych:  mood and affect appropriate  Fetal Status: Fetal Heart Rate (bpm): 140 Fundal Height: 35 cm Movement: Present    Chaperone: n/a    No results found for this or any previous  visit (from the past 24 hour(s)).   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  Labs/procedures today: none  Plan:  Continue routine obstetrical care  Next visit: prefers in person    Reviewed: Term labor symptoms and general obstetric precautions including but not limited to vaginal bleeding, contractions, leaking of fluid and fetal movement were reviewed in detail with the patient.  All questions were answered. Pt has home bp cuff. Check bp weekly, let us know if >140/90.   Follow-up: Return in about 1 week (around 03/24/2021) for LROB visit.  No orders of the defined types were placed in this encounter.   Myna Hidalgo, DO Attending Obstetrician & Gynecologist, [facility] for [employer], [facility]

## 2021-03-22 NOTE — L&D Delivery Note (Signed)
OB/GYN Faculty Practice Delivery Note  Heather Hendrix is a 26 y.o. G1P0 s/p SVD at [redacted]w[redacted]d. She was admitted for IOL for oligo and non reassuring fetal testing (BPP 2/8) and FGR (8%ile).   ROM: 0h 76m with meconium stained fluid GBS Status: positive Maximum Maternal Temperature: 99  Labor Progress: Presented for IOL, was 3 cm and started on pitocin. She progressed to complete  Delivery Date/Time: 0524 on 03/25/2020 Delivery: Called to room and patient was complete and pushing. Head delivered LOA. No nuchal cord present. Shoulder and body delivered in usual fashion. Infant coated with copious amounts of meconium and did not have spontaneous cry and therefore the cord clamped x 2 after immediately and cut by Dr. Ephriam Jenkins and infant was passed off to awaiting NICU team. Once cord cut infant with loud spontaneous cry. Cord blood drawn. Placenta delivered spontaneously with gentle cord traction. Fundus firm with massage and Pitocin. Labia, perineum, vagina, and cervix inspected and found to have a 1st degree perineal laceration that was bleeding and therefore was repaired with two figure of eight stitches.   Placenta: Intact, green hue, 3V cord, to Path Complications: None Lacerations: 1st degree EBL: 75cc Analgesia: epidural   Infant: female   APGARs 8,9   weight pending  Warner Mccreedy, MD, MPH OB Fellow, Faculty Practice Center for [facility], [facility] 03/25/2021, 5:45 AM

## 2021-03-24 ENCOUNTER — Ambulatory Visit (INDEPENDENT_AMBULATORY_CARE_PROVIDER_SITE_OTHER): Payer: Medicaid Other | Admitting: Women's Health

## 2021-03-24 ENCOUNTER — Inpatient Hospital Stay (HOSPITAL_BASED_OUTPATIENT_CLINIC_OR_DEPARTMENT_OTHER): Payer: Medicaid Other

## 2021-03-24 ENCOUNTER — Inpatient Hospital Stay (HOSPITAL_COMMUNITY)
Admission: AD | Admit: 2021-03-24 | Discharge: 2021-03-27 | DRG: 806 | Disposition: A | Discharge status: Home / Self Care | Payer: Medicaid Other | Attending: Obstetrics and Gynecology | Admitting: Obstetrics and Gynecology

## 2021-03-24 ENCOUNTER — Other Ambulatory Visit: Payer: Self-pay

## 2021-03-24 ENCOUNTER — Encounter: Payer: Self-pay | Admitting: Women's Health

## 2021-03-24 ENCOUNTER — Encounter (HOSPITAL_COMMUNITY): Payer: Self-pay | Admitting: Obstetrics and Gynecology

## 2021-03-24 VITALS — BP 118/77 | HR 86 | Wt 154.0 lb

## 2021-03-24 DIAGNOSIS — B009 Herpesviral infection, unspecified: Secondary | ICD-10-CM

## 2021-03-24 DIAGNOSIS — Z7689 Persons encountering health services in other specified circumstances: Secondary | ICD-10-CM | POA: Diagnosis present

## 2021-03-24 DIAGNOSIS — Z3A38 38 weeks gestation of pregnancy: Secondary | ICD-10-CM | POA: Diagnosis not present

## 2021-03-24 DIAGNOSIS — O212 Late vomiting of pregnancy: Secondary | ICD-10-CM | POA: Diagnosis present

## 2021-03-24 DIAGNOSIS — O99334 Smoking (tobacco) complicating childbirth: Secondary | ICD-10-CM | POA: Diagnosis present

## 2021-03-24 DIAGNOSIS — Z3403 Encounter for supervision of normal first pregnancy, third trimester: Secondary | ICD-10-CM

## 2021-03-24 DIAGNOSIS — O4103X0 Oligohydramnios, third trimester, not applicable or unspecified: Secondary | ICD-10-CM | POA: Diagnosis not present

## 2021-03-24 DIAGNOSIS — O99284 Endocrine, nutritional and metabolic diseases complicating childbirth: Secondary | ICD-10-CM | POA: Diagnosis present

## 2021-03-24 DIAGNOSIS — O365930 Maternal care for other known or suspected poor fetal growth, third trimester, not applicable or unspecified: Secondary | ICD-10-CM | POA: Diagnosis present

## 2021-03-24 DIAGNOSIS — B951 Streptococcus, group B, as the cause of diseases classified elsewhere: Secondary | ICD-10-CM

## 2021-03-24 DIAGNOSIS — Z20822 Contact with and (suspected) exposure to covid-19: Secondary | ICD-10-CM | POA: Diagnosis present

## 2021-03-24 DIAGNOSIS — Z3483 Encounter for supervision of other normal pregnancy, third trimester: Secondary | ICD-10-CM

## 2021-03-24 DIAGNOSIS — Z23 Encounter for immunization: Secondary | ICD-10-CM

## 2021-03-24 DIAGNOSIS — Z3689 Encounter for other specified antenatal screening: Secondary | ICD-10-CM

## 2021-03-24 DIAGNOSIS — O4100X0 Oligohydramnios, unspecified trimester, not applicable or unspecified: Secondary | ICD-10-CM | POA: Diagnosis present

## 2021-03-24 DIAGNOSIS — O09899 Supervision of other high risk pregnancies, unspecified trimester: Secondary | ICD-10-CM

## 2021-03-24 DIAGNOSIS — O99324 Drug use complicating childbirth: Secondary | ICD-10-CM | POA: Diagnosis present

## 2021-03-24 DIAGNOSIS — F1290 Cannabis use, unspecified, uncomplicated: Secondary | ICD-10-CM | POA: Diagnosis present

## 2021-03-24 DIAGNOSIS — O99824 Streptococcus B carrier state complicating childbirth: Secondary | ICD-10-CM | POA: Diagnosis present

## 2021-03-24 DIAGNOSIS — O26843 Uterine size-date discrepancy, third trimester: Secondary | ICD-10-CM | POA: Diagnosis not present

## 2021-03-24 DIAGNOSIS — O26849 Uterine size-date discrepancy, unspecified trimester: Secondary | ICD-10-CM

## 2021-03-24 DIAGNOSIS — F17210 Nicotine dependence, cigarettes, uncomplicated: Secondary | ICD-10-CM | POA: Diagnosis present

## 2021-03-24 DIAGNOSIS — R112 Nausea with vomiting, unspecified: Secondary | ICD-10-CM

## 2021-03-24 DIAGNOSIS — Z3490 Encounter for supervision of normal pregnancy, unspecified, unspecified trimester: Secondary | ICD-10-CM

## 2021-03-24 DIAGNOSIS — O365990 Maternal care for other known or suspected poor fetal growth, unspecified trimester, not applicable or unspecified: Secondary | ICD-10-CM | POA: Diagnosis present

## 2021-03-24 DIAGNOSIS — R634 Abnormal weight loss: Secondary | ICD-10-CM

## 2021-03-24 DIAGNOSIS — E876 Hypokalemia: Secondary | ICD-10-CM | POA: Diagnosis present

## 2021-03-24 DIAGNOSIS — Z2839 Other underimmunization status: Secondary | ICD-10-CM

## 2021-03-24 HISTORY — DX: Scoliosis, unspecified: M41.9

## 2021-03-24 LAB — CBC
HCT: 39.7 % (ref 36.0–46.0)
Hemoglobin: 13.2 g/dL (ref 12.0–15.0)
MCH: 30.6 pg (ref 26.0–34.0)
MCHC: 33.2 g/dL (ref 30.0–36.0)
MCV: 92.1 fL (ref 80.0–100.0)
Platelets: 253 10*3/uL (ref 150–400)
RBC: 4.31 MIL/uL (ref 3.87–5.11)
RDW: 13.2 % (ref 11.5–15.5)
WBC: 23.1 10*3/uL — ABNORMAL HIGH (ref 4.0–10.5)
nRBC: 0 % (ref 0.0–0.2)

## 2021-03-24 LAB — TYPE AND SCREEN
ABO/RH(D): O POS
Antibody Screen: NEGATIVE

## 2021-03-24 LAB — RESP PANEL BY RT-PCR (FLU A&B, COVID) ARPGX2
Influenza A by PCR: NEGATIVE
Influenza B by PCR: NEGATIVE
SARS Coronavirus 2 by RT PCR: NEGATIVE

## 2021-03-24 LAB — AMNISURE RUPTURE OF MEMBRANE (ROM) NOT AT ARMC: Amnisure ROM: NEGATIVE

## 2021-03-24 LAB — COMPREHENSIVE METABOLIC PANEL
ALT: 10 U/L (ref 0–44)
AST: 14 U/L — ABNORMAL LOW (ref 15–41)
Albumin: 3.2 g/dL — ABNORMAL LOW (ref 3.5–5.0)
Alkaline Phosphatase: 120 U/L (ref 38–126)
Anion gap: 14 (ref 5–15)
BUN: 17 mg/dL (ref 6–20)
CO2: 23 mmol/L (ref 22–32)
Calcium: 9.2 mg/dL (ref 8.9–10.3)
Chloride: 102 mmol/L (ref 98–111)
Creatinine, Ser: 0.93 mg/dL (ref 0.44–1.00)
GFR, Estimated: >60 mL/min (ref >60)
Glucose, Bld: 126 mg/dL — ABNORMAL HIGH (ref 70–99)
Potassium: 3 mmol/L — ABNORMAL LOW (ref 3.5–5.1)
Sodium: 139 mmol/L (ref 135–145)
Total Bilirubin: 1.5 mg/dL — ABNORMAL HIGH (ref 0.3–1.2)
Total Protein: 7 g/dL (ref 6.5–8.1)

## 2021-03-24 LAB — URINALYSIS, ROUTINE W REFLEX MICROSCOPIC
Glucose, UA: NEGATIVE mg/dL
Hgb urine dipstick: NEGATIVE
Ketones, ur: >80 mg/dL — AB
Leukocytes,Ua: NEGATIVE
Nitrite: NEGATIVE
Protein, ur: 30 mg/dL — AB
Specific Gravity, Urine: 1.02 (ref 1.005–1.030)
pH: 6 (ref 5.0–8.0)

## 2021-03-24 LAB — URINALYSIS, MICROSCOPIC (REFLEX): Bacteria, UA: NONE SEEN

## 2021-03-24 MED ORDER — MENTHOL 3 MG MT LOZG
1.0000 | LOZENGE | Freq: Once | OROMUCOSAL | Status: AC
  Administered 2021-03-24: 3 mg via ORAL
  Filled 2021-03-24: qty 9

## 2021-03-24 MED ORDER — ONDANSETRON HCL 4 MG/2ML IJ SOLN
4.0000 mg | Freq: Four times a day (QID) | INTRAMUSCULAR | Status: DC | PRN
  Administered 2021-03-24: 4 mg via INTRAVENOUS
  Filled 2021-03-24: qty 2

## 2021-03-24 MED ORDER — OXYTOCIN-SODIUM CHLORIDE 30-0.9 UT/500ML-% IV SOLN
1.0000 m[IU]/min | INTRAVENOUS | Status: DC
  Administered 2021-03-24: 2 m[IU]/min via INTRAVENOUS

## 2021-03-24 MED ORDER — PENICILLIN G POT IN DEXTROSE 60000 UNIT/ML IV SOLN
3.0000 10*6.[IU] | INTRAVENOUS | Status: DC
  Administered 2021-03-24 – 2021-03-25 (×2): 3 10*6.[IU] via INTRAVENOUS
  Filled 2021-03-24 (×2): qty 50

## 2021-03-24 MED ORDER — OXYCODONE-ACETAMINOPHEN 5-325 MG PO TABS: 1.0000 | ORAL_TABLET | ORAL | Status: DC | PRN

## 2021-03-24 MED ORDER — LIDOCAINE HCL (PF) 1 % IJ SOLN: 30.0000 mL | INTRAMUSCULAR | Status: DC | PRN

## 2021-03-24 MED ORDER — ACETAMINOPHEN 325 MG PO TABS: 650.0000 mg | ORAL_TABLET | ORAL | Status: DC | PRN

## 2021-03-24 MED ORDER — LACTATED RINGERS IV SOLN: INTRAVENOUS | Status: DC

## 2021-03-24 MED ORDER — OXYCODONE-ACETAMINOPHEN 5-325 MG PO TABS: 2.0000 | ORAL_TABLET | ORAL | Status: DC | PRN

## 2021-03-24 MED ORDER — FAMOTIDINE IN NACL 20-0.9 MG/50ML-% IV SOLN
20.0000 mg | Freq: Once | INTRAVENOUS | Status: AC
  Administered 2021-03-24: 20 mg via INTRAVENOUS
  Filled 2021-03-24: qty 50

## 2021-03-24 MED ORDER — OXYTOCIN BOLUS FROM INFUSION
333.0000 mL | Freq: Once | INTRAVENOUS | Status: AC
  Administered 2021-03-25: 333 mL via INTRAVENOUS

## 2021-03-24 MED ORDER — SOD CITRATE-CITRIC ACID 500-334 MG/5ML PO SOLN: 30.0000 mL | ORAL | Status: DC | PRN

## 2021-03-24 MED ORDER — TERBUTALINE SULFATE 1 MG/ML IJ SOLN: 0.2500 mg | Freq: Once | INTRAMUSCULAR | Status: DC | PRN

## 2021-03-24 MED ORDER — SODIUM CHLORIDE 0.9 % IV SOLN
5.0000 10*6.[IU] | Freq: Once | INTRAVENOUS | Status: AC
  Administered 2021-03-24: 5 10*6.[IU] via INTRAVENOUS
  Filled 2021-03-24: qty 5

## 2021-03-24 MED ORDER — LACTATED RINGERS IV BOLUS
1000.0000 mL | Freq: Once | INTRAVENOUS | Status: AC
  Administered 2021-03-24: 1000 mL via INTRAVENOUS

## 2021-03-24 MED ORDER — SODIUM CHLORIDE 0.9 % IV SOLN
12.5000 mg | INTRAVENOUS | Status: DC | PRN
  Administered 2021-03-25: 12.5 mg via INTRAVENOUS
  Filled 2021-03-24: qty 0.5

## 2021-03-24 MED ORDER — FENTANYL CITRATE (PF) 100 MCG/2ML IJ SOLN
100.0000 ug | INTRAMUSCULAR | Status: DC | PRN
  Administered 2021-03-25: 100 ug via INTRAVENOUS
  Filled 2021-03-24: qty 2

## 2021-03-24 MED ORDER — OXYTOCIN-SODIUM CHLORIDE 30-0.9 UT/500ML-% IV SOLN
2.5000 [IU]/h | INTRAVENOUS | Status: DC
  Filled 2021-03-24: qty 500

## 2021-03-24 MED ORDER — LACTATED RINGERS IV SOLN: 500.0000 mL | INTRAVENOUS | Status: DC | PRN

## 2021-03-24 MED ORDER — SODIUM CHLORIDE 0.9 % IV SOLN
8.0000 mg | Freq: Once | INTRAVENOUS | Status: AC
  Administered 2021-03-24: 8 mg via INTRAVENOUS
  Filled 2021-03-24: qty 4

## 2021-03-24 NOTE — MAU Note (Signed)
Just really nauseated.  OB sent her because she had lost 20 lbs since she was there last wk.  Hasn't been able to keep anything down for the past 2 days. Throat is killing  her, so hard to swallow- very painful.  also having pain in upper abd. No fever or diarrhea.

## 2021-03-24 NOTE — Patient Instructions (Signed)
Jamila, thank you for choosing our office today! We appreciate the opportunity to meet your healthcare needs. You may receive a short survey by mail, e-mail, or through MyChart. If you are happy with your care we would appreciate if you could take just a few minutes to complete the survey questions. We read all of your comments and take your feedback very seriously. Thank you again for choosing our office.  [facility]  [facility] [place] ([address] [place], [place] [address]) Entrance C, located off of [address] Free 24/7 valet parking   CLASSES: Go to [email or web address] to register for classes (childbirth, breastfeeding, waterbirth, infant CPR, daddy bootcamp, etc.)  Call the office ([phone]) or go to [facility] if: You begin to have strong, frequent contractions Your water breaks.  Sometimes it is a big gush of fluid, sometimes it is just a trickle that keeps getting your panties wet or running down your legs You have vaginal bleeding.  It is normal to have a small amount of spotting if your cervix was checked.  You don't feel your baby moving like normal.  If you don't, get you something to eat and drink and lay down and focus on feeling your baby move.   If your baby is still not moving like normal, you should call the office or go to [facility].  Call the office ([phone]) or [facility] for these signs of pre-eclampsia: Severe headache that does not go away with Tylenol Visual changes- seeing spots, double, blurred vision Pain under your right breast or upper abdomen that does not go away with Tums or heartburn medicine Nausea and/or vomiting Severe swelling in your hands, feet, and face   [place] Pediatricians/Family Doctors [facility] ([facility]): [address], [phone]           [facility]: [address], [phone]                  [facility] ([facility]): [address], [phone] (call to ask if accepting patients) [facility]: [address], [place], [phone]    [place] Pediatricians/Family Doctors [facility] ([facility]): [address], [phone] [facility]: [address], [phone] [facility]: [address], [phone]  [place] [facility]  [facility] ([facility]): [phone] [facility]: [address], [phone]   [place] [facility]: [address], [phone]  [place] [facility]  [facility]: [address], [phone]  Home Blood Pressure Monitoring for Patients   Your provider has recommended that you check your blood pressure (BP) at least once a week at home. If you do not have a blood pressure cuff at home, one will be provided for you. Contact your provider if you have not received your monitor within 1 week.   Helpful Tips for Accurate Home Blood Pressure Checks  Don't smoke, exercise, or drink caffeine 30 minutes before checking your BP Use the restroom before checking your BP (a full bladder can raise your pressure) Relax in a comfortable upright chair Feet on the ground Left arm resting comfortably on a flat surface at the level of your heart Legs uncrossed Back supported Sit quietly and don't talk Place the cuff on your bare arm Adjust snuggly, so that only two fingertips   can fit between your skin and the top of the cuff Check 2 readings separated by at least one minute Keep a log of your BP readings For a visual, please reference this diagram: http://ccnc.care/bpdiagram  Provider Name: [facility]     Phone: [phone]  Zone 1: ALL CLEAR  Continue to monitor your symptoms:  BP reading is less than 140 (top number) or less than 90 (bottom number)  No right  upper stomach pain No headaches or seeing spots No feeling nauseated or throwing up No swelling in face and hands  Zone 2: CAUTION Call your doctor's office for any of the following:  BP reading is greater than 140 (top number) or greater than 90 (bottom number)  Stomach pain under your ribs in the middle or right side Headaches or seeing spots Feeling nauseated or throwing up Swelling in face and hands  Zone 3: EMERGENCY  Seek immediate medical care if you have any of the following:  BP reading is greater than160 (top number) or greater than 110 (bottom number) Severe headaches not improving with Tylenol Serious difficulty catching your breath Any worsening symptoms from Zone 2   Braxton Hicks Contractions Contractions of the uterus can occur throughout pregnancy, but they are not always a sign that you are in labor. You may have practice contractions called Braxton Hicks contractions. These false labor contractions are sometimes confused with true labor. What are Braxton Hicks contractions? Braxton Hicks contractions are tightening movements that occur in the muscles of the uterus before labor. Unlike true labor contractions, these contractions do not result in opening (dilation) and thinning of the cervix. Toward the end of pregnancy (32-34 weeks), Braxton Hicks contractions can happen more often and may become stronger. These contractions are sometimes difficult to tell apart from true labor because they can be very uncomfortable. You should not feel embarrassed if you go to the hospital with false labor. Sometimes, the only way to tell if you are in true labor is for your health care provider to look for changes in the cervix. The health care provider will do a physical exam and may monitor your contractions. If you are not in true labor, the exam should show that your cervix is not dilating and your water has not broken. If there are no other health problems associated with your  pregnancy, it is completely safe for you to be sent home with false labor. You may continue to have Braxton Hicks contractions until you go into true labor. How to tell the difference between true labor and false labor True labor Contractions last 30-70 seconds. Contractions become very regular. Discomfort is usually felt in the top of the uterus, and it spreads to the lower abdomen and low back. Contractions do not go away with walking. Contractions usually become more intense and increase in frequency. The cervix dilates and gets thinner. False labor Contractions are usually shorter and not as strong as true labor contractions. Contractions are usually irregular. Contractions are often felt in the front of the lower abdomen and in the groin. Contractions may go away when you walk around or change positions while lying down. Contractions get weaker and are shorter-lasting as time goes on. The cervix usually does not dilate or become thin. Follow these instructions at home:  Take over-the-counter and prescription medicines only as told by your health care provider. Keep up with your usual exercises and follow other instructions from your health care provider. Eat and drink lightly if you think   you are going into labor. If Braxton Hicks contractions are making you uncomfortable: Change your position from lying down or resting to walking, or change from walking to resting. Sit and rest in a tub of warm water. Drink enough fluid to keep your urine pale yellow. Dehydration may cause these contractions. Do slow and deep breathing several times an hour. Keep all follow-up prenatal visits as told by your health care provider. This is important. Contact a health care provider if: You have a fever. You have continuous pain in your abdomen. Get help right away if: Your contractions become stronger, more regular, and closer together. You have fluid leaking or gushing from your vagina. You pass  blood-tinged mucus (bloody show). You have bleeding from your vagina. You have low back pain that you never had before. You feel your baby's head pushing down and causing pelvic pressure. Your baby is not moving inside you as much as it used to. Summary Contractions that occur before labor are called Braxton Hicks contractions, false labor, or practice contractions. Braxton Hicks contractions are usually shorter, weaker, farther apart, and less regular than true labor contractions. True labor contractions usually become progressively stronger and regular, and they become more frequent. Manage discomfort from Braxton Hicks contractions by changing position, resting in a warm bath, drinking plenty of water, or practicing deep breathing. This information is not intended to replace advice given to you by your health care provider. Make sure you discuss any questions you have with your health care provider. Document Revised: 02/18/2017 Document Reviewed: 07/22/2016 Elsevier Patient Education  2020 [employer]   

## 2021-03-24 NOTE — MAU Provider Note (Signed)
Ms. Heather Hendrix is a 26 y.o. G1P0 at [redacted]w[redacted]d who presents to MAU after she was seen for her regular OB appt and she was found to have lost 20lbs since her previous visit. Patient states for the past 2 days she has been vomiting consistently and has not been able to eat or drink anything. Patient states she last smoked marijuana around the time she found out she was pregnant. Patient states over the past two days she has been able to drink milk, but she vomits the milk about 15-69min after drinking. Patient also reports the same thing happens when she has attempted Sprite in the past 2 days. Patient also endorses a very sore throat, which "hurts to even swallow my own spit" since the past 2 days of vomiting. Patient reports vomiting 20-30 times daily. Patient reports her throat hurts so much that she took a Percocet from her neighbor this morning.  Patient also states that she was told she was measuring small and was told to have an [place] for that here in MAU.  Pt denies VB, LOF, ctx, decreased FM, vaginal discharge/odor/itching. Pt denies abdominal pain, constipation, diarrhea, or urinary problems. Pt denies fever, chills, fatigue, sweating or changes in appetite. Pt denies SOB or chest pain. Pt denies dizziness, HA, light-headedness, weakness.  Problems this pregnancy include: none. Allergies? NKDA Current medications/supplements? Zofran (last took yesterday around 6PM), acyclovir Prenatal care provider? [facility], next appt 03/30/2021  -UA: mod bilirubin/>80ketones/30PRO -CBC: WBCs 23.1 -CMP: K 3.0, bilirubin 1.5 -Zofran, Pepcid, LR given for nausea and vomiting, pt reports vomiting resolved after administration -FH measuring 32 in MAU, c/w office visit today, growth [place] ordered -pt denies leaking of fluid, but states she did notice increased white discharge in the past 3-4 days -[place]; oligio (1.8cm), IUGR 8%, AC<1%, BPP 2/8, VTX, normal Dopplers -EFM: reactive with ?late       -baseline:  130       -variability: moderate       -accels: present, 15x15       -decels: ?late       -TOCO: irritability -consulted with Dr. Damita Dunnings to recommend admission, Dr. Damita Dunnings agrees with plan, will attempt vaginal delivery -admit to L&D  OB History     Gravida  1   Para      Term      Preterm      AB      Living         SAB      IAB      Ectopic      Multiple      Live Births             Past Medical History:  Diagnosis Date   PCOS (polycystic ovarian syndrome)    Past Surgical History:  Procedure Laterality Date   NO PAST SURGERIES     Family History: family history includes COPD in her paternal grandmother; Heart disease in her maternal grandfather. Social History:  reports that she has been smoking cigarettes. She has been smoking an average of 1 pack per day. She has never used smokeless tobacco. She reports that she does not currently use alcohol. She reports current drug use. Drugs: Hydrocodone and Other-see comments.     Maternal Diabetes: No Genetic Screening: Normal Maternal Ultrasounds/Referrals: Normal Fetal Ultrasounds or other Referrals:  None Maternal Substance Abuse:  Yes:  Type: Smoker, Marijuana, Prescription drugs Pt using Percocet not prescribed to her from neighbor. Significant Maternal Medications:  Meds  include: Other: acyclovir, Zofran, Phenergan Significant Maternal Lab Results:  Group B Strep positive and Other: rubella non-immune, hypokalemia Other Comments:  None  Review of Systems  Constitutional:  Positive for unexpected weight change. Negative for chills, diaphoresis, fatigue and fever.  Eyes:  Negative for visual disturbance.  Respiratory:  Negative for shortness of breath.   Cardiovascular:  Negative for chest pain.  Gastrointestinal:  Positive for nausea and vomiting. Negative for abdominal pain, constipation and diarrhea.  Genitourinary:  Negative for dysuria, flank pain, frequency, pelvic pain, urgency, vaginal bleeding  and vaginal discharge.  Neurological:  Negative for dizziness, weakness, light-headedness and headaches.  Maternal Medical History:  Reason for admission: Nausea.      Blood pressure 126/75, pulse 64, temperature 98.1 F (36.7 C), temperature source Axillary, resp. rate 18, height 5\' 6"  (1.676 m), weight 69.7 kg, last menstrual period 05/20/2020, SpO2 99 %.  Patient Vitals for the past 24 hrs:  BP Temp Temp src Pulse Resp SpO2 Height Weight  03/24/21 1704 126/75 -- -- 64 18 99 % -- --  03/24/21 1337 -- 98.1 F (36.7 C) Axillary -- -- -- -- --  03/24/21 1318 110/64 -- Oral 82 16 100 % -- --  03/24/21 1254 140/74 99.1 F (37.3 C) Oral 66 14 98 % 5\' 6"  (1.676 m) 69.7 kg   Exam Physical Exam Constitutional:      General: She is not in acute distress.    Appearance: She is well-developed. She is not diaphoretic.  HENT:     Head: Normocephalic and atraumatic.  Pulmonary:     Effort: Pulmonary effort is normal.  Abdominal:     General: There is no distension.     Palpations: Abdomen is soft. There is no mass.     Tenderness: There is no abdominal tenderness. There is no guarding or rebound.  Skin:    General: Skin is warm and dry.  Neurological:     Mental Status: She is alert and oriented to person, place, and time.  Psychiatric:        Behavior: Behavior normal.        Thought Content: Thought content normal.        Judgment: Judgment normal.    Prenatal labs: ABO, Rh: --/--/PENDING (01/03 1420) Antibody: PENDING (01/03 1420) Rubella: <0.90 (07/25 1453) RPR: Non Reactive (10/17 0827)  HBsAg: Negative (07/25 1453)  HIV: Non Reactive (10/17 0827)  GBS: Positive/-- (12/19 1507)   Assessment/Plan: 1. Encounter for supervision of normal first pregnancy in third trimester   2. Uterine size date discrepancy pregnancy   3. Poor fetal growth affecting management of mother in third trimester, single or unspecified fetus   4. Hypokalemia   5. Oligohydramnios in third trimester,  single or unspecified fetus   6. Herpes   7. Rubella non-immune status, antepartum   8. Marijuana use   9. [redacted] weeks gestation of pregnancy   10. NST (non-stress test) reactive   11. Group B streptococcal infection during pregnancy    -admit to L&D for induction  Gerrie Nordmann Oswin Griffith 03/24/2021, 6:17 PM

## 2021-03-24 NOTE — Progress Notes (Signed)
Heather Hendrix is a 26 y.o. G1P0 at [redacted]w[redacted]d  admitted for IOL for oligo, BPP 2/8  Subjective: Reports nausea somewhat better after Zofran  Objective: BP 126/75 (BP Location: Left Arm)    Pulse 64    Temp 98.1 F (36.7 C) (Axillary)    Resp 18    Ht 5\' 6"  (1.676 m)    Wt 69.7 kg    LMP 05/20/2020    SpO2 99%    BMI 24.79 kg/m  No intake/output data recorded. No intake/output data recorded.  FHT:  FHR: 130 bpm, variability: moderate,  accelerations:  Present,  decelerations:  Absent UC:   irregular SVE:    3/70/-3  Labs: Lab Results  Component Value Date   WBC 23.1 (H) 03/24/2021   HGB 13.2 03/24/2021   HCT 39.7 03/24/2021   MCV 92.1 03/24/2021   PLT 253 03/24/2021    Assessment / Plan: IOL for oligo, BPP 2/8  Labor:  cervix 3 cm and 60-70% effaced. Will start pitocin   Fetal Wellbeing:  Category I Pain Control:   plans epidural I/D:   GBS pos>PCN   4/8 03/24/2021, 9:00 PM

## 2021-03-24 NOTE — MAU Note (Signed)
Patient sent from [facility] for IVF and an U/S because she has been sick. Patient states that she took a "pain pill that her downstairs neighbor give her to help her sleep", she does not know the name of the medication. UA to lab.

## 2021-03-24 NOTE — Progress Notes (Signed)
LOW-RISK PREGNANCY VISIT Patient name: Heather Hendrix MRN [id number]  Date of birth: [birth year] Chief Complaint:   Routine Prenatal Visit (Nauseated and vomiting passed 2 day. Unable keep any thing down)  History of Present Illness:   Heather Hendrix is a 26 y.o. G1P0 female at [redacted]w[redacted]d with an Estimated Date of Delivery: 04/04/21 being seen today for ongoing management of a low-risk pregnancy.   Today she reports  n/v x 2d, unable to keep solids down, just started being able to keep some liquids down last night. Started w/ a mild cough 2d ago then n/v hit, no diarrhea, no fever. Sore throat she attributes to vomiting. . Contractions: Irritability.  .  Movement: Present. denies leaking of fluid.  Depression screen [facility] 2/9 01/05/2021 10/13/2020  Decreased Interest 1 2  Down, Depressed, Hopeless 1 1  PHQ - 2 Score 2 3  Altered sleeping 1 2  Tired, decreased energy 2 3  Change in appetite 1 3  Feeling bad or failure about yourself  0 1  Trouble concentrating 1 0  Moving slowly or fidgety/restless 0 0  Suicidal thoughts 0 0  PHQ-9 Score 7 12     GAD 7 : Generalized Anxiety Score 01/05/2021 10/13/2020  Nervous, Anxious, on Edge 0 1  Control/stop worrying 0 0  Worry too much - different things 1 2  Trouble relaxing 1 0  Restless 0 0  Easily annoyed or irritable 2 2  Afraid - awful might happen 0 1  Total GAD 7 Score 4 6      Review of Systems:   Pertinent items are noted in HPI Denies abnormal vaginal discharge w/ itching/odor/irritation, headaches, visual changes, shortness of breath, chest pain, abdominal pain, severe nausea/vomiting, or problems with urination or bowel movements unless otherwise stated above. Pertinent History Reviewed:  Reviewed past medical,surgical, social, obstetrical and family history.  Reviewed problem list, medications and allergies. Physical Assessment:   Vitals:   03/24/21 0937  BP: 118/77  Pulse: 86  Weight: 154 lb (69.9 kg)  Body mass  index is 24.86 kg/m. Repeated weight: 153.8lb Weight 174 (12/27), 173 (12/19), 171 (12/12) Temp 98.5         Physical Examination:   General appearance: Well appearing, and in no distress  Mental status: Alert, oriented to person, place, and time  Skin: Warm & dry  Cardiovascular: Normal heart rate noted  Respiratory: Normal respiratory effort, no distress  Abdomen: Soft, gravid, nontender  Pelvic: Cervical exam deferred         Extremities: Edema: None  Fetal Status: Fetal Heart Rate (bpm): 120 Fundal Height: 32 cm Movement: Present    Chaperone: Angel Neas   No results found for this or any previous visit (from the past 24 hour(s)).  Assessment & Plan:  1) Low-risk pregnancy G1P0 at [redacted]w[redacted]d with an Estimated Date of Delivery: 04/04/21   2) N/V x 2d w/ 20lb wt loss in 1wk, discussed w/ LHE, to MAU, note routed to Thalia Bloodgood, CNM  3) Uterine size<dates- efw u/s in MAU   Meds: No orders of the defined types were placed in this encounter.  Labs/procedures today: none  Plan:  to MAU Next visit: prefers in person    Reviewed: Term labor symptoms and general obstetric precautions including but not limited to vaginal bleeding, contractions, leaking of fluid and fetal movement were reviewed in detail with the patient.  All questions were answered. Does have home bp cuff. Office bp cuff given: not applicable. Check bp  weekly, let us know if consistently >140 and/or >90.  Follow-up: Return for weekly, As scheduled.  Future Appointments  Date Time Provider Department Center  03/30/2021  9:50 AM Cheral Marker, CNM [facility]    No orders of the defined types were placed in this encounter.  Cheral Marker CNM, [facility] 03/24/2021 10:20 AM

## 2021-03-25 ENCOUNTER — Observation Stay (HOSPITAL_COMMUNITY): Payer: Medicaid Other | Admitting: Anesthesiology

## 2021-03-25 ENCOUNTER — Encounter (HOSPITAL_COMMUNITY): Payer: Self-pay | Admitting: Obstetrics and Gynecology

## 2021-03-25 DIAGNOSIS — O212 Late vomiting of pregnancy: Secondary | ICD-10-CM | POA: Diagnosis present

## 2021-03-25 DIAGNOSIS — O99820 Streptococcus B carrier state complicating pregnancy: Secondary | ICD-10-CM | POA: Diagnosis not present

## 2021-03-25 DIAGNOSIS — Z3490 Encounter for supervision of normal pregnancy, unspecified, unspecified trimester: Secondary | ICD-10-CM | POA: Diagnosis present

## 2021-03-25 DIAGNOSIS — Z23 Encounter for immunization: Secondary | ICD-10-CM | POA: Diagnosis not present

## 2021-03-25 DIAGNOSIS — O99824 Streptococcus B carrier state complicating childbirth: Secondary | ICD-10-CM | POA: Diagnosis present

## 2021-03-25 DIAGNOSIS — Z3A38 38 weeks gestation of pregnancy: Secondary | ICD-10-CM | POA: Diagnosis not present

## 2021-03-25 DIAGNOSIS — F1290 Cannabis use, unspecified, uncomplicated: Secondary | ICD-10-CM | POA: Diagnosis present

## 2021-03-25 DIAGNOSIS — O99284 Endocrine, nutritional and metabolic diseases complicating childbirth: Secondary | ICD-10-CM | POA: Diagnosis present

## 2021-03-25 DIAGNOSIS — O365930 Maternal care for other known or suspected poor fetal growth, third trimester, not applicable or unspecified: Secondary | ICD-10-CM | POA: Diagnosis present

## 2021-03-25 DIAGNOSIS — Z7689 Persons encountering health services in other specified circumstances: Secondary | ICD-10-CM | POA: Diagnosis present

## 2021-03-25 DIAGNOSIS — F17210 Nicotine dependence, cigarettes, uncomplicated: Secondary | ICD-10-CM | POA: Diagnosis present

## 2021-03-25 DIAGNOSIS — E876 Hypokalemia: Secondary | ICD-10-CM | POA: Diagnosis present

## 2021-03-25 DIAGNOSIS — O99334 Smoking (tobacco) complicating childbirth: Secondary | ICD-10-CM | POA: Diagnosis present

## 2021-03-25 DIAGNOSIS — O4103X0 Oligohydramnios, third trimester, not applicable or unspecified: Secondary | ICD-10-CM | POA: Diagnosis present

## 2021-03-25 DIAGNOSIS — Z20822 Contact with and (suspected) exposure to covid-19: Secondary | ICD-10-CM | POA: Diagnosis present

## 2021-03-25 DIAGNOSIS — O99324 Drug use complicating childbirth: Secondary | ICD-10-CM | POA: Diagnosis present

## 2021-03-25 LAB — CBC
HCT: 34.2 % — ABNORMAL LOW (ref 36.0–46.0)
Hemoglobin: 12.1 g/dL (ref 12.0–15.0)
MCH: 32.3 pg (ref 26.0–34.0)
MCHC: 35.4 g/dL (ref 30.0–36.0)
MCV: 91.2 fL (ref 80.0–100.0)
Platelets: 225 10*3/uL (ref 150–400)
RBC: 3.75 MIL/uL — ABNORMAL LOW (ref 3.87–5.11)
RDW: 13.2 % (ref 11.5–15.5)
WBC: 19.8 10*3/uL — ABNORMAL HIGH (ref 4.0–10.5)
nRBC: 0 % (ref 0.0–0.2)

## 2021-03-25 LAB — RPR: RPR Ser Ql: NONREACTIVE

## 2021-03-25 MED ORDER — ONDANSETRON HCL 4 MG PO TABS: 4.0000 mg | ORAL_TABLET | ORAL | Status: DC | PRN

## 2021-03-25 MED ORDER — LACTATED RINGERS IV SOLN: 500.0000 mL | Freq: Once | INTRAVENOUS | Status: DC

## 2021-03-25 MED ORDER — EPHEDRINE 5 MG/ML INJ: 10.0000 mg | INTRAVENOUS | Status: DC | PRN

## 2021-03-25 MED ORDER — IBUPROFEN 600 MG PO TABS
600.0000 mg | ORAL_TABLET | Freq: Four times a day (QID) | ORAL | Status: DC
  Filled 2021-03-25: qty 1

## 2021-03-25 MED ORDER — MEDROXYPROGESTERONE ACETATE 150 MG/ML IM SUSP: 150.0000 mg | INTRAMUSCULAR | Status: DC | PRN

## 2021-03-25 MED ORDER — OXYTOCIN BOLUS FROM INFUSION: 333.0000 mL | Freq: Once | INTRAVENOUS | Status: DC

## 2021-03-25 MED ORDER — SIMETHICONE 80 MG PO CHEW: 80.0000 mg | CHEWABLE_TABLET | ORAL | Status: DC | PRN

## 2021-03-25 MED ORDER — DIPHENHYDRAMINE HCL 50 MG/ML IJ SOLN: 12.5000 mg | INTRAMUSCULAR | Status: DC | PRN

## 2021-03-25 MED ORDER — PHENYLEPHRINE 40 MCG/ML (10ML) SYRINGE FOR IV PUSH (FOR BLOOD PRESSURE SUPPORT): 80.0000 ug | PREFILLED_SYRINGE | INTRAVENOUS | Status: DC | PRN

## 2021-03-25 MED ORDER — TETANUS-DIPHTH-ACELL PERTUSSIS 5-2.5-18.5 LF-MCG/0.5 IM SUSY: 0.5000 mL | PREFILLED_SYRINGE | Freq: Once | INTRAMUSCULAR | Status: DC

## 2021-03-25 MED ORDER — COCONUT OIL OIL: 1.0000 "application " | TOPICAL_OIL | Status: DC | PRN

## 2021-03-25 MED ORDER — ACETAMINOPHEN 325 MG PO TABS: 650.0000 mg | ORAL_TABLET | ORAL | Status: DC | PRN

## 2021-03-25 MED ORDER — BENZOCAINE-MENTHOL 20-0.5 % EX AERO: 1.0000 "application " | INHALATION_SPRAY | CUTANEOUS | Status: DC | PRN

## 2021-03-25 MED ORDER — DIBUCAINE (PERIANAL) 1 % EX OINT: 1.0000 "application " | TOPICAL_OINTMENT | CUTANEOUS | Status: DC | PRN

## 2021-03-25 MED ORDER — FENTANYL-BUPIVACAINE-NACL 0.5-0.125-0.9 MG/250ML-% EP SOLN
12.0000 mL/h | EPIDURAL | Status: DC | PRN
  Administered 2021-03-25: 12 mL/h via EPIDURAL

## 2021-03-25 MED ORDER — DIPHENHYDRAMINE HCL 25 MG PO CAPS: 25.0000 mg | ORAL_CAPSULE | Freq: Four times a day (QID) | ORAL | Status: DC | PRN

## 2021-03-25 MED ORDER — PRENATAL MULTIVITAMIN CH: 1.0000 | ORAL_TABLET | Freq: Every day | ORAL | Status: DC

## 2021-03-25 MED ORDER — IBUPROFEN 100 MG/5ML PO SUSP
600.0000 mg | Freq: Four times a day (QID) | ORAL | Status: DC
  Administered 2021-03-26 – 2021-03-27 (×4): 600 mg via ORAL
  Filled 2021-03-25 (×5): qty 30

## 2021-03-25 MED ORDER — FENTANYL-BUPIVACAINE-NACL 0.5-0.125-0.9 MG/250ML-% EP SOLN
EPIDURAL | Status: AC
  Filled 2021-03-25: qty 250

## 2021-03-25 MED ORDER — WITCH HAZEL-GLYCERIN EX PADS: 1.0000 "application " | MEDICATED_PAD | CUTANEOUS | Status: DC | PRN

## 2021-03-25 MED ORDER — LACTATED RINGERS IV BOLUS
1000.0000 mL | Freq: Once | INTRAVENOUS | Status: AC
  Administered 2021-03-25: 1000 mL via INTRAVENOUS

## 2021-03-25 MED ORDER — MEASLES, MUMPS & RUBELLA VAC IJ SOLR
0.5000 mL | Freq: Once | INTRAMUSCULAR | Status: AC
  Administered 2021-03-27: 0.5 mL via SUBCUTANEOUS

## 2021-03-25 MED ORDER — ONDANSETRON HCL 4 MG/2ML IJ SOLN
4.0000 mg | INTRAMUSCULAR | Status: DC | PRN
  Administered 2021-03-25 (×2): 4 mg via INTRAVENOUS
  Filled 2021-03-25 (×2): qty 2

## 2021-03-25 MED ORDER — SENNOSIDES-DOCUSATE SODIUM 8.6-50 MG PO TABS: 2.0000 | ORAL_TABLET | Freq: Every day | ORAL | Status: DC

## 2021-03-25 MED ORDER — LIDOCAINE HCL (PF) 1 % IJ SOLN
INTRAMUSCULAR | Status: DC | PRN
  Administered 2021-03-25: 8 mL via EPIDURAL

## 2021-03-25 NOTE — Lactation Note (Addendum)
This note was copied from a baby's chart. Lactation Consultation Note  Patient Name: Heather Hendrix [id number] Date: 03/25/2021   Age:24 hours Per dad, infant has not been latching well at the breast , see flow sheet  the past few feedings are formula only. LC talked with dad and MGM,  mom will call LC services when ready for latch assistance. Mom is tired and  resting , she had a lot of nausea in her  pregnancy and some after delivery of infant.  Maternal Data    Feeding    LATCH Score                    Lactation Tools Discussed/Used    Interventions    Discharge    Consult Status      Danelle Earthly 03/25/2021, 10:03 PM

## 2021-03-25 NOTE — Anesthesia Postprocedure Evaluation (Signed)
Anesthesia Post Note  Patient: Press photographer  Procedure(s) Performed: AN AD HOC LABOR EPIDURAL     Patient location during evaluation: Mother Baby Anesthesia Type: Epidural Level of consciousness: awake and alert Pain management: pain level controlled Vital Signs Assessment: post-procedure vital signs reviewed and stable Respiratory status: spontaneous breathing, nonlabored ventilation and respiratory function stable Cardiovascular status: stable Postop Assessment: no headache, no backache and epidural receding Anesthetic complications: no   No notable events documented.  Last Vitals:  Vitals:   03/25/21 0825 03/25/21 0937  BP: 135/73 133/71  Pulse: (!) 50 (!) 57  Resp: 18 18  Temp: 37 C 36.8 C  SpO2: 100% 100%    Last Pain:  Vitals:   03/25/21 1335  TempSrc:   PainSc: 0-No pain   Pain Goal:                   [name]

## 2021-03-25 NOTE — Anesthesia Preprocedure Evaluation (Addendum)
Anesthesia Evaluation  Patient identified by MRN, date of birth, ID band Patient awake    Reviewed: Allergy & Precautions, NPO status , Patient's Chart, lab work & pertinent test results  Airway Mallampati: II  TM Distance: >3 FB Neck ROM: Full    Dental no notable dental hx.    Pulmonary neg pulmonary ROS, Current Smoker,    Pulmonary exam normal breath sounds clear to auscultation       Cardiovascular negative cardio ROS Normal cardiovascular exam Rhythm:Regular Rate:Normal     Neuro/Psych negative neurological ROS  negative psych ROS   GI/Hepatic negative GI ROS, (+)     substance abuse  marijuana use,   Endo/Other  negative endocrine ROS  Renal/GU negative Renal ROS  negative genitourinary   Musculoskeletal  (+) narcotic dependentscoliosis   Abdominal   Peds negative pediatric ROS (+)  Hematology negative hematology ROS (+)   Anesthesia Other Findings   Reproductive/Obstetrics (+) Pregnancy                            Anesthesia Physical Anesthesia Plan  ASA: 3  Anesthesia Plan: Epidural   Post-op Pain Management:    Induction:   PONV Risk Score and Plan: 1  Airway Management Planned: Natural Airway  Additional Equipment:   Intra-op Plan:   Post-operative Plan:   Informed Consent: I have reviewed the patients History and Physical, chart, labs and discussed the procedure including the risks, benefits and alternatives for the proposed anesthesia with the patient or authorized representative who has indicated his/her understanding and acceptance.       Plan Discussed with: Anesthesiologist  Anesthesia Plan Comments:         Anesthesia Quick Evaluation

## 2021-03-25 NOTE — Lactation Note (Signed)
This note was copied from a baby's chart. Lactation Consultation Note  Patient Name: Heather Hendrix [id number] Date: 03/25/2021 Reason for consult: Initial assessment;Primapara;1st time breastfeeding;Early term 37-38.6wks;Infant < 6lbs;Maternal endocrine disorder;Breastfeeding assistance;Other (Comment) (2nd LC visit/ baby STS/ awake/ LC offered to assist and baby latched with depth and few strong sucks and released. LC reviewed BF basics of ET / < 6 pounds/. SET up the DEBP / mom to sleepy to start the pumping. Will F/U/)- Latch score 6  Age:71 hours MBU RN Christella Hartigan reported to [facility] baby had fed recently fed 8 mins.  Mom reported to [facility] she had not eaten solid food in 2 days due to being sick at home/ bale to tolerate fluids so far.   Maternal Data Has patient been taught Hand Expression?:  (LC hand expressed - noted tiny drop) Does the patient have breastfeeding experience prior to this delivery?: No  Feeding Mother's Current Feeding Choice: Breast Milk and Formula  LATCH Score Latch: Repeated attempts needed to sustain latch, nipple held in mouth throughout feeding, stimulation needed to elicit sucking reflex.  Audible Swallowing: None  Type of Nipple: Everted at rest and after stimulation  Comfort (Breast/Nipple): Soft / non-tender  Hold (Positioning): Assistance needed to correctly position infant at breast and maintain latch.  LATCH Score: 6   Lactation Tools Discussed/Used    Interventions Interventions: Breast feeding basics reviewed;Assisted with latch;Skin to skin;Hand express;Breast compression;Adjust position;Position options;DEBP;Education;LC Services brochure  Discharge WIC Program: No  Consult Status Consult Status: Follow-up Date: 03/25/21 Follow-up type: In-patient    [name] 03/25/2021, 9:32 AM

## 2021-03-25 NOTE — Lactation Note (Signed)
This note was copied from a baby's chart. Lactation Consultation Note  Patient Name: Heather Hendrix OVFIE'P Date: 03/25/2021 Reason for consult: L&D Initial assessment;Primapara;1st time breastfeeding;Early term 37-38.6wks;Infant < 6lbs;Maternal endocrine disorder;Other (Comment) (LC visited mom in LD Room 213/ dad holding baby wrapped in blankets/ mom resting in bed dozing. per LD RN, preparing to transfer to [facility] . Mom , dad aware LC will visit them on the [facility] .) Age:12 hours  Maternal Data    Feeding Mother's Current Feeding Choice: Breast Milk and Formula  LATCH Score                    Lactation Tools Discussed/Used    Interventions    Discharge    Consult Status Consult Status: Follow-up from L&D Date: 03/25/21 Follow-up type: In-patient    Matilde Sprang Lynne Righi 03/25/2021, 7:59 AM

## 2021-03-25 NOTE — Plan of Care (Signed)
Problem: Education: °Goal: Knowledge of Childbirth will improve °Outcome: Completed/Met °Goal: Ability to make informed decisions regarding treatment and plan of care will improve °Outcome: Completed/Met °Goal: Ability to state and carry out methods to decrease the pain will improve °Outcome: Completed/Met °Goal: Individualized Educational Video(s) °Outcome: Completed/Met °  °Problem: Coping: °Goal: Ability to verbalize concerns and feelings about labor and delivery will improve °Outcome: Completed/Met °  °Problem: Life Cycle: °Goal: Ability to make normal progression through stages of labor will improve °Outcome: Completed/Met °Goal: Ability to effectively push during vaginal delivery will improve °Outcome: Completed/Met °  °Problem: Role Relationship: °Goal: Will demonstrate positive interactions with the child °Outcome: Completed/Met °  °Problem: Safety: °Goal: Risk of complications during labor and delivery will decrease °Outcome: Completed/Met °  °Problem: Pain Management: °Goal: Relief or control of pain from uterine contractions will improve °Outcome: Completed/Met °  °Problem: Education: °Goal: Knowledge of General Education information will improve °Description: Including pain rating scale, medication(s)/side effects and non-pharmacologic comfort measures °Outcome: Completed/Met °  °Problem: Health Behavior/Discharge Planning: °Goal: Ability to manage health-related needs will improve °Outcome: Completed/Met °  °Problem: Clinical Measurements: °Goal: Ability to maintain clinical measurements within normal limits will improve °Outcome: Completed/Met °Goal: Will remain free from infection °Outcome: Completed/Met °Goal: Diagnostic test results will improve °Outcome: Completed/Met °Goal: Respiratory complications will improve °Outcome: Completed/Met °Goal: Cardiovascular complication will be avoided °Outcome: Completed/Met °  °Problem: Activity: °Goal: Risk for activity intolerance will decrease °Outcome:  Completed/Met °  °Problem: Nutrition: °Goal: Adequate nutrition will be maintained °Outcome: Completed/Met °  °Problem: Coping: °Goal: Level of anxiety will decrease °Outcome: Completed/Met °  °Problem: Elimination: °Goal: Will not experience complications related to bowel motility °Outcome: Completed/Met °Goal: Will not experience complications related to urinary retention °Outcome: Completed/Met °  °Problem: Pain Managment: °Goal: General experience of comfort will improve °Outcome: Completed/Met °  °Problem: Safety: °Goal: Ability to remain free from injury will improve °Outcome: Completed/Met °  °Problem: Skin Integrity: °Goal: Risk for impaired skin integrity will decrease °Outcome: Completed/Met °  °

## 2021-03-25 NOTE — Discharge Summary (Signed)
Postpartum Discharge Summary     Patient Name: Heather Hendrix DOB: 1995/11/06 MRN: [id number]  Date of admission: 03/24/2021 Delivery date:03/25/2021  Delivering provider: Renard Matter  Date of discharge: 03/27/2021  Admitting diagnosis: IUGR (intrauterine growth restriction) affecting care of mother [O36.5990] Encounter for induction of labor [Z34.90] Intrauterine pregnancy: [redacted]w[redacted]d    Secondary diagnosis:  Principal Problem:   IUGR (intrauterine growth restriction) affecting care of mother Active Problems:   Hypokalemia   Oligohydramnios   Encounter for induction of labor   Vaginal delivery  Additional problems: None    Discharge diagnosis: Term Pregnancy Delivered                                              Post partum procedures:   None Augmentation: Pitocin Complications: None  Hospital course: Induction of Labor With Vaginal Delivery   26y.o. yo G1P0 at 322w4das admitted to the hospital 03/24/2021 for induction of labor.  Indication for induction:  Oligo, IUGR, and BPP 2/8 .  Patient had an uncomplicated labor course as follows: Membrane Rupture Time/Date: 4:27 AM ,03/25/2021   Delivery Method:Vaginal, Spontaneous  Episiotomy: None  Lacerations:  1st degree;Perineal  Details of delivery can be found in separate delivery note.  Patient had a routine postpartum course. Patient is discharged home 03/27/21.  Newborn Data: Birth date:03/25/2021  Birth time:5:24 AM  Gender:Female  Living status:Living  Apgars:8 ,9  Weight:2650 g   Magnesium Sulfate received: No BMZ received: No Rhophylac:N/A MMR:No T-DaP:Given prenatally Flu: N/A Transfusion:No  Physical exam  Vitals:   03/26/21 0500 03/26/21 1520 03/26/21 2135 03/27/21 0525  BP: 126/78 113/72 124/73 135/80  Pulse: (!) 52 (!) 54 (!) 51 (!) 46  Resp: _0 Temp: 97.7 F (36.5 C) 98.8 F (37.1 C) 99.3 F (37.4 C) 99.4 F (37.4 C)  TempSrc: Axillary Oral Oral Oral  SpO2:      Weight:      Height:        General: alert, cooperative, and no distress Lochia: appropriate Uterine Fundus: firm Incision: N/A DVT Evaluation: No evidence of DVT seen on physical exam. Labs: Lab Results  Component Value Date   WBC 19.8 (H) 03/25/2021   HGB 12.1 03/25/2021   HCT 34.2 (L) 03/25/2021   MCV 91.2 03/25/2021   PLT 225 03/25/2021   CMP Latest Ref Rng & Units 03/27/2021  Glucose 70 - 99 mg/dL 78  BUN 6 - 20 mg/dL 12  Creatinine 0.44 - 1.00 mg/dL 0.68  Sodium 135 - 145 mmol/L 140  Potassium 3.5 - 5.1 mmol/L 3.3(L)  Chloride 98 - 111 mmol/L 106  CO2 22 - 32 mmol/L 27  Calcium 8.9 - 10.3 mg/dL 8.2(L)  Total Protein 6.5 - 8.1 g/dL -  Total Bilirubin 0.3 - 1.2 mg/dL -  Alkaline Phos 38 - 126 U/L -  AST 15 - 41 U/L -  ALT 0 - 44 U/L -   Edinburgh Score: Edinburgh Postnatal Depression Scale Screening Tool 03/26/2021  I have been able to laugh and see the funny side of things. 0  I have looked forward with enjoyment to things. 1  I have blamed myself unnecessarily when things went wrong. 1  I have been anxious or worried for no good reason. 1  I have felt scared or panicky for no good reason. 1  Things have been getting on top of me. 0  I have been so unhappy that I have had difficulty sleeping. 0  I have felt sad or miserable. 1  I have been so unhappy that I have been crying. 1  The thought of harming myself has occurred to me. 0  Edinburgh Postnatal Depression Scale Total 6     After visit meds:  Allergies as of 03/27/2021   No Known Allergies      Medication List     TAKE these medications    acetaminophen 325 MG tablet Commonly known as: Tylenol Take 2 tablets (650 mg total) by mouth every 4 (four) hours as needed (for pain scale < 4).   acyclovir 400 MG tablet Commonly known as: ZOVIRAX Take 1 tablet (400 mg total) by mouth 3 (three) times daily.   ibuprofen 100 MG/5ML suspension Commonly known as: ADVIL Take 30 mLs (600 mg total) by mouth every 6 (six) hours.    ondansetron 8 MG disintegrating tablet Commonly known as: Zofran ODT Take 1 tablet (8 mg total) by mouth every 8 (eight) hours as needed for nausea or vomiting.   PRENATAL VITAMIN PO Take 1 tablet by mouth daily.   promethazine 25 MG tablet Commonly known as: PHENERGAN Take 0.5-1 tablets (12.5-25 mg total) by mouth every 6 (six) hours as needed for nausea or vomiting.         Discharge home in stable condition Infant Feeding: Bottle and Breast Infant Disposition:home with mother Discharge instruction: per After Visit Summary and Postpartum booklet. Activity: Advance as tolerated. Pelvic rest for 6 weeks.  Diet: routine diet Future Appointments: Future Appointments  Date Time Provider [place]  04/29/2021  1:50 PM Myrtis Ser, CNM [facility]   Follow up Visit:  Follow-up [place]. Schedule an appointment as soon as possible for a visit in 4 week(s).   Contact information: [place] [address] [phone]               Message sent to [facility] by Dr. Cy Blamer on 03/25/2021  Please schedule this patient for a In person postpartum visit in 4 weeks with the following provider: Any provider. Additional Postpartum F/U: N/A   High risk pregnancy complicated by:  oligo, FGR Delivery mode:  Vaginal, Spontaneous  Anticipated Birth Control:  OCPs   Renard Matter, MD, MPH OB Fellow, Faculty Practice

## 2021-03-25 NOTE — Anesthesia Procedure Notes (Signed)
Epidural Patient location during procedure: OB Start time: 03/25/2021 3:38 AM End time: 03/25/2021 3:48 AM  Staffing Anesthesiologist: Merlinda Frederick, MD Performed: anesthesiologist   Preanesthetic Checklist Completed: patient identified, IV checked, site marked, risks and benefits discussed, monitors and equipment checked, pre-op evaluation and timeout performed  Epidural Patient position: sitting Prep: DuraPrep Patient monitoring: heart rate, cardiac monitor, continuous pulse ox and blood pressure Approach: midline Location: L3-L4 Injection technique: LOR saline  Needle:  Needle type: Tuohy  Needle gauge: 17 G Needle length: 9 cm Needle insertion depth: 4.5 cm Catheter type: closed end flexible Catheter size: 20 Guage Catheter at skin depth: 9.5 cm Test dose: negative and Other  Assessment Events: blood not aspirated, injection not painful, no injection resistance and negative IV test  Additional Notes Informed consent obtained prior to proceeding including risk of failure, 1% risk of PDPH, risk of minor discomfort and bruising.  Discussed rare but serious complications including epidural abscess, permanent nerve injury, epidural hematoma.  Discussed alternatives to epidural analgesia and patient desires to proceed.  Timeout performed pre-procedure verifying patient name, procedure, and platelet count.  Patient tolerated procedure well.

## 2021-03-26 LAB — COMPREHENSIVE METABOLIC PANEL
ALT: 11 U/L (ref 0–44)
AST: 17 U/L (ref 15–41)
Albumin: 2.2 g/dL — ABNORMAL LOW (ref 3.5–5.0)
Alkaline Phosphatase: 92 U/L (ref 38–126)
Anion gap: 6 (ref 5–15)
BUN: 10 mg/dL (ref 6–20)
CO2: 29 mmol/L (ref 22–32)
Calcium: 8.5 mg/dL — ABNORMAL LOW (ref 8.9–10.3)
Chloride: 107 mmol/L (ref 98–111)
Creatinine, Ser: 0.69 mg/dL (ref 0.44–1.00)
GFR, Estimated: >60 mL/min (ref >60)
Glucose, Bld: 97 mg/dL (ref 70–99)
Potassium: 2.9 mmol/L — ABNORMAL LOW (ref 3.5–5.1)
Sodium: 142 mmol/L (ref 135–145)
Total Bilirubin: 0.6 mg/dL (ref 0.3–1.2)
Total Protein: 5.3 g/dL — ABNORMAL LOW (ref 6.5–8.1)

## 2021-03-26 LAB — SURGICAL PATHOLOGY

## 2021-03-26 LAB — LIPASE, BLOOD: Lipase: 42 U/L (ref 11–51)

## 2021-03-26 MED ORDER — FAMOTIDINE IN NACL 20-0.9 MG/50ML-% IV SOLN
20.0000 mg | Freq: Once | INTRAVENOUS | Status: AC
  Administered 2021-03-26: 20 mg via INTRAVENOUS
  Filled 2021-03-26: qty 50

## 2021-03-26 MED ORDER — POTASSIUM CHLORIDE CRYS ER 20 MEQ PO TBCR
40.0000 meq | EXTENDED_RELEASE_TABLET | Freq: Four times a day (QID) | ORAL | Status: DC
  Filled 2021-03-26 (×2): qty 2

## 2021-03-26 MED ORDER — MENTHOL 3 MG MT LOZG
1.0000 | LOZENGE | OROMUCOSAL | Status: DC | PRN
  Administered 2021-03-26: 3 mg via ORAL
  Filled 2021-03-26: qty 9

## 2021-03-26 MED ORDER — POTASSIUM CHLORIDE 10 MEQ/100ML IV SOLN
10.0000 meq | INTRAVENOUS | Status: AC
  Administered 2021-03-26 (×4): 10 meq via INTRAVENOUS
  Filled 2021-03-26 (×6): qty 100

## 2021-03-26 MED ORDER — SODIUM CHLORIDE 0.9 % IV SOLN
12.5000 mg | Freq: Four times a day (QID) | INTRAVENOUS | Status: DC | PRN
  Administered 2021-03-26 – 2021-03-27 (×3): 12.5 mg via INTRAVENOUS
  Filled 2021-03-26: qty 0.5
  Filled 2021-03-26 (×2): qty 12.5

## 2021-03-26 MED ORDER — LACTATED RINGERS IV BOLUS
1000.0000 mL | Freq: Once | INTRAVENOUS | Status: AC
  Administered 2021-03-26: 1000 mL via INTRAVENOUS

## 2021-03-26 NOTE — Progress Notes (Addendum)
Post Partum Day #1 Heather Hendrix is a 26 y.o. G1P0 PPD#1 after SVD at [redacted]w[redacted]d admitted for IOL for oligo, BPP 2/8  Subjective: Patient reports she continues to have nausea and vomiting. She tried Zofran but wasn't able to keep it down. Also reports epigastric abdominal pain.  Able to take some sips of water but not able to keep food down.  Objective: Blood pressure 126/78, pulse (!) 52, temperature 97.7 F (36.5 C), temperature source Axillary, resp. rate 18, height 5\' 6"  (1.676 m), weight 69.7 kg, last menstrual period 05/20/2020, SpO2 99 %, unknown if currently breastfeeding.  Physical Exam:   DVT Evaluation: No evidence of DVT seen on physical exam. Negative Homan's sign.  Recent Labs    03/24/21 1424 03/25/21 1309  HGB 13.2 12.1  HCT 39.7 34.2*    Assessment/Plan Postpartum day 1 Bleeding and lower abdominal pain appropriate. No lower extremity pain. Vital signs stable. Plans 6 week post partum OCP for contraception.  2. Nausea vomiting Patient started having intractable nausea and vomiting since 4 days ago. Urine normal. No respiratory symptoms. Likely viral gastro infection. However given epigastric pain pancreatitis is also on differential. Also concern for dehydration at this time and given not able to keep fluids down - 1000cc LR - DC Zofran and start Phenergan for nausea - Famotidine IV for epigastric pain - Check CMP and lipase IV fluids & Phenergan for N/V   LOS: 2 days   05/23/21 03/26/2021, 7:59 AM   GME ATTESTATION:  I saw and evaluated the patient. I agree with the findings and the plan of care as documented in the students note and have made all necessary edits.  05/24/2021, MD, MPH OB Fellow, Faculty Practice [facility], [facility] 03/26/2021 9:04 AM

## 2021-03-26 NOTE — Clinical Social Work Maternal (Addendum)
CLINICAL SOCIAL WORK MATERNAL/CHILD NOTE  Patient Details  Name: Heather Hendrix MRN: [id number] Date of Birth: [birth year]  Date:  03/26/2021  Clinical Social Worker Initiating Note:  Kathrin Greathouse, [place] Date/Time: Initiated:  03/26/21/1220     Child's Name:  Nevada Crane   Biological Parents:  Mother, Father Lizeth Bencosme 11-26-1997, Melton Krebs 11-30-1987)   Need for Interpreter:  None   Reason for Referral:  Current Substance Use/Substance Use During Pregnancy     Address:  [address] [place] [address]    Phone number:  [phone] (home)     Additional phone number:   Household Members/Support Persons (HM/SP):   Household Member/Support Person 1   HM/SP Name Relationship DOB or Age  HM/SP -[place] Significant Other 11-30-1987  HM/SP -2        HM/SP -3        HM/SP -4        HM/SP -5        HM/SP -6        HM/SP -7        HM/SP -8          Natural Supports (not living in the home):  Parent, Extended Family   Professional Supports: None   Employment: Self-employed   Type of Work: Air traffic controller   Education:  [employer] school graduate   Homebound arranged:    Museum/gallery curator Resources:  [employer]   Other Resources:  Physicist, medical     Cultural/Religious Considerations Which May Impact Care:    Strengths:  Ability to meet basic needs  , Home prepared for child  , Pediatrician chosen   Psychotropic Medications:         Pediatrician:       Pediatrician List:   [facility] Other ([place])  [facility]      Pediatrician Fax Number:    Risk Factors/Current Problems:      Cognitive State:  Able to Concentrate  , Alert     Mood/Affect:  Calm     CSW Assessment: CSW received consult for positive for THC use, opiates during the pregnancy. CSW met with MOB to offer support and complete assessment.    CSW met with MOB at bedside  and introduced CSW role. CSW observed MOB lying in the bed with her eyes closed, FOB present at bedside and the infant asleep in the bassinet. MOB opened eyes and invited CSW to stay to complete the assessment. MOB reported having nausea symptoms but feeling well enough to talk. CSW offered MOB privacy and FOB left the room to allow MOB privacy. MOB confirmed that the demographic information on his hospital file is correct. MOB reported she and FOB live together. MOB identified FOB, her mom, and grandpa as supports. 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 MOB shared she does receive food stamps and plans to apply for [facility]. CSW encouraged MOB to apply for [facility] sooner than later since she plans to bottle feed.  CSW inquired about if MOB has mental health history. MOB reported that she has struggled with depression since age 3. MOB shared that she feels down some days but never suicidal or homicidal. MOB reported she received therapy at age 53 which she felt was helpful at the time but in not currently interested in therapy services.  MOB reported over the years she has learned to cope by taking it one day at time and evaluating her emotions and concerns before moving forward. CSW encouraged MOB to continue implementing healthy coping strategies and doing things that make her happy. MOB was knowledgeable about PPD and receptive to the education provided. CSW provided education regarding the baby blues period vs. perinatal mood disorders, discussed treatment and gave resources for mental health follow up if concerns arise.  CSW recommended MOB complete a self-evaluation during the postpartum time period using the New Mom Checklist from Postpartum Progress and encouraged MOB to contact a medical professional if symptoms are noted at any time. MOB reported she feels comfortable reaching out to her doctor if she has concerns.  MOB denied SI/HI/DV  CSW inquired  about MOB substance use during the pregnancy. MOB disclosed that she smoked marijuana prior to pregnancy and during the pregnancy until about four months ago. MOB reported having severe nausea and hyperemesis during the pregnancy and used THC to help with the symptoms. 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 MOB reported she took a Xanax and Percocet a day before admitting to the hospital, for pain and to help her sleep. MOB reported she took 2-3 pills every two weeks for pain and to help her sleep. MOB reported the pills are a mixture of Percocet's, oxycodone, and Xanax but the Xanax are rarely given. MOB reported she tried to go to a pain clinic in [place] to help with her back pain but left after she was told that her insurance would not cover the treatment. MOB reported the doctor told her she should not take her neighbors prescription pills, but she could not tolerate being in pain. MOB reported she took pain medication before the pregnancy but not as frequently. CSW informed MOB about the hospital drug screen policy as it related to [facility] and non-prescribed medications. CSW made MOB aware that CSW will follow the infant's UDS/CDS and make a report to CPS, if warranted. MOB reported that the infant will probably be positive since she took a Percocet and Xanax a day before admitting. MOB presented questions about the follow up process and reported that she would have not taken the prescriptions from her neighbor if she was going to get in trouble.  MOB reported she always felt the infant move and felt it was not problem for the baby. MOB reported that FOB is aware that she takes pain medications. CSW offered MOB SA resources. MOB declined. MOB encouraged MOB to talk with a primary care provider about her back pain. MOB reported she will follow up with a  doctor.   MOB reported she has all essential items for the infant including a bassinet where the infant will sleep.CSW provided review of Sudden Infant Death Syndrome (SIDS) precautions.   MOB reported understanding. MOB has chosen [place] for the infant's follow up care and will have transportation. MOB reported no additional needs at this time.   CSW will follow the infant's UDS/CDS and make a report to CPS, if warranted.  CSW identifies no further need for intervention and no barriers to discharge at this time.   CSW Plan/Description:  Perinatal Mood and Anxiety Disorder (PMADs) Education, [place], CSW Will Continue to Monitor Umbilical Cord Tissue Drug Screen Results and  Make Report if Warranted, Sudden Infant Death Syndrome (SIDS) Education, No Further Intervention Required/No Barriers to Discharge    Lia Hopping, LCSW 03/26/2021, 1:32 PM

## 2021-03-27 LAB — BASIC METABOLIC PANEL
Anion gap: 7 (ref 5–15)
BUN: 12 mg/dL (ref 6–20)
CO2: 27 mmol/L (ref 22–32)
Calcium: 8.2 mg/dL — ABNORMAL LOW (ref 8.9–10.3)
Chloride: 106 mmol/L (ref 98–111)
Creatinine, Ser: 0.68 mg/dL (ref 0.44–1.00)
GFR, Estimated: >60 mL/min (ref >60)
Glucose, Bld: 78 mg/dL (ref 70–99)
Potassium: 3.3 mmol/L — ABNORMAL LOW (ref 3.5–5.1)
Sodium: 140 mmol/L (ref 135–145)

## 2021-03-27 LAB — MAGNESIUM: Magnesium: 2 mg/dL (ref 1.7–2.4)

## 2021-03-27 MED ORDER — IBUPROFEN 100 MG/5ML PO SUSP: 600.0000 mg | Freq: Four times a day (QID) | ORAL | 0 refills | Status: DC

## 2021-03-27 MED ORDER — ACETAMINOPHEN 325 MG PO TABS
650.0000 mg | ORAL_TABLET | ORAL | 0 refills | Status: AC | PRN
Start: 2021-03-27 — End: ?

## 2021-03-27 NOTE — Progress Notes (Signed)
When first four doses of Potassium were complete, pt stated that she refused remaining doses. She felt nauseous again and requested PRN Phenergan. Asked patient again if she was willing to restart Potassium when Phenergan was complete but she declined.

## 2021-03-27 NOTE — Lactation Note (Signed)
This note was copied from a baby's chart. Lactation Consultation Note  Patient Name: Boy Bobbiejean Mafi [id number] Date: 03/27/2021 Reason for consult: Follow-up assessment;Other (Comment) (per mom I'm working on feeling better and going to formula feed the baby. [place] reviewed with mom how to dry up her milk is it comes in and S/S of mastitis.) Age:24 hours  Maternal Data    Feeding Mother's Current Feeding Choice: Formula  LATCH Score                    Lactation Tools Discussed/Used    Interventions    Discharge    Consult Status Consult Status: Complete Date: 03/27/21    Myer Haff 03/27/2021, 9:50 AM

## 2021-03-30 ENCOUNTER — Encounter: Payer: Medicaid Other | Admitting: Women's Health

## 2021-04-07 ENCOUNTER — Telehealth (HOSPITAL_COMMUNITY): Payer: Self-pay | Admitting: *Deleted

## 2021-04-07 NOTE — Telephone Encounter (Signed)
Hospital Discharge Follow-Up Call:  Patient reports that she is doing well and has no concerns about her health.  EPDS today was 8 and she endorses that overall she is doing well emotionally.  Patient says that baby is well and she has no concerns about baby's health.  She reports that baby sleeps both in a crib or in a playpen depending on time of day.  ABCs of Safe Sleep reviewed.

## 2021-04-09 ENCOUNTER — Other Ambulatory Visit: Payer: Self-pay | Admitting: Women's Health

## 2021-04-29 ENCOUNTER — Ambulatory Visit: Payer: Medicaid Other | Admitting: Advanced Practice Midwife

## 2021-05-04 ENCOUNTER — Ambulatory Visit: Payer: Medicaid Other | Admitting: Advanced Practice Midwife

## 2021-05-27 ENCOUNTER — Ambulatory Visit: Payer: Medicaid Other | Admitting: Nurse Practitioner

## 2021-06-29 ENCOUNTER — Ambulatory Visit (INDEPENDENT_AMBULATORY_CARE_PROVIDER_SITE_OTHER): Payer: Medicaid Other

## 2021-06-29 ENCOUNTER — Ambulatory Visit (INDEPENDENT_AMBULATORY_CARE_PROVIDER_SITE_OTHER): Payer: Medicaid Other | Admitting: Nurse Practitioner

## 2021-06-29 ENCOUNTER — Encounter: Payer: Self-pay | Admitting: Nurse Practitioner

## 2021-06-29 VITALS — BP 127/83 | HR 80 | Ht 66.0 in | Wt 168.0 lb

## 2021-06-29 DIAGNOSIS — M5489 Other dorsalgia: Secondary | ICD-10-CM | POA: Diagnosis not present

## 2021-06-29 DIAGNOSIS — Z7689 Persons encountering health services in other specified circumstances: Secondary | ICD-10-CM

## 2021-06-29 DIAGNOSIS — M542 Cervicalgia: Secondary | ICD-10-CM | POA: Diagnosis not present

## 2021-06-29 MED ORDER — IBUPROFEN 600 MG PO TABS
600.0000 mg | ORAL_TABLET | Freq: Three times a day (TID) | ORAL | 0 refills | Status: AC | PRN
Start: 2021-06-29 — End: ?

## 2021-06-29 MED ORDER — METHOCARBAMOL 500 MG PO TABS: 500.0000 mg | ORAL_TABLET | Freq: Four times a day (QID) | ORAL | 1 refills | Status: DC

## 2021-06-29 NOTE — Assessment & Plan Note (Signed)
Unresolved neck pain aggravated by history of scoliosis.  Completed lumbar/back x-ray to rule out deformity.  Patient will need referral to orthopedic for reassessment.  Advised patient to put on brace, anti-inflammatory and muscle relaxant for pain and muscle spasm as needed.  Follow-up for worsening unresolved symptoms.  Education handout printed and given to patient. ?

## 2021-06-29 NOTE — Patient Instructions (Signed)
Scoliosis ?Scoliosis is a condition in which the spine curves sideways. Normally, the spine does not curve side-to-side (laterally). With scoliosis, the spine may curve to the left, to the right, or in both directions. The curve of the spine is measured by angles in degrees. ?Scoliosis can affect people at any age, but it is more common among children and adolescents. ?What are the causes? ?The cause of scoliosis is not always known. It may be caused by: ?A birth defect. ?A disease that can cause problems in the muscles or imbalance of the body, such as cerebral palsy or muscular dystrophy. ?What are the signs or symptoms? ?This condition may not cause any symptoms. If you do have symptoms, they may include: ?Leaning to one side. ?Sunken chest and uneven shoulders. ?One side of the body being different or larger than the other side (asymmetry). ?An abnormal curve in the back. ?Pain, which may limit physical activity. ?Shortness of breath. ?Bowel or bladder control problems, such as not knowing when you have to go. This can be a sign of nerve damage. ?How is this diagnosed? ?This condition is diagnosed based on: ?Your medical history. ?Your symptoms. ?A physical exam. This may include: ?Examining your nerves, muscles, and reflexes (neurological exam). ?Testing the movement of your spine (range of motion study). ?Imaging tests, such as: ?X-rays. ?MRI. ?How is this treated? ?Treatment for this condition depends on the severity of the symptoms. Treatment may include: ?Observation to make sure that your scoliosis does not get worse (progress). You may need to have regular visits with your health care provider. ?A back brace to prevent scoliosis from progressing. This may be needed during times of fast growth (growth spurts), such as during adolescence. ?Medicine to help relieve pain. ?Physical therapy. ?Surgery. ?Follow these instructions at home: ?If you have a brace: ?Wear the brace as told by your health care  provider. Remove it only as told by your health care provider. ?Loosen the brace if your fingers or toes tingle, become numb, or turn cold and blue. ?Keep the brace clean. ?If the brace is not waterproof: ?Do not let it get wet. ?Cover it with a watertight covering when you take a bath or a shower. ?General instructions ?Take over-the-counter and prescription medicines only as told by your health care provider. ?Do not drive or use heavy machinery while taking prescription pain medicine. ?If physical therapy was prescribed, do exercises as instructed. ?Before starting any new sports or physical activities, ask your health care provider whether they are safe for you. ?Keep all follow-up visits as told by your health care provider. This is important. ?Contact a health care provider if you have: ?Problems with your back brace, such as skin irritation or discomfort. ?Back pain that does not get better with medicine. ?Get help right away if: ?Your legs feel weak. ?You cannot move your legs. ?You cannot control when you urinate or pass stool (loss of bladder or bowel control). ?Summary ?Scoliosis is a condition of having a spine that curves sideways. The spine may curve to the left, to the right, or in both directions. ?This condition may be caused by birth defects or diseases that affect muscles and body balance. ?Follow your health care provider's instructions about wearing a brace, doing physical activities, and keeping follow-up visits. ?This information is not intended to replace advice given to you by your health care provider. Make sure you discuss any questions you have with your health care provider. ?Document Revised: 08/08/2017 Document Reviewed: 06/23/2017 ?Elsevier  Patient Education ? [place]. ?Acute Back Pain, Adult ?Acute back pain is sudden and usually short-lived. It is often caused by an injury to the muscles and tissues in the back. The injury may result from: ?A muscle, tendon, or ligament  getting overstretched or torn. Ligaments are tissues that connect bones to each other. Lifting something improperly can cause a back strain. ?Wear and tear (degeneration) of the spinal disks. Spinal disks are circular tissue that provide cushioning between the bones of the spine (vertebrae). ?Twisting motions, such as while playing sports or doing yard work. ?A hit to the back. ?Arthritis. ?You may have a physical exam, lab tests, and imaging tests to find the cause of your pain. Acute back pain usually goes away with rest and home care. ?Follow these instructions at home: ?Managing pain, stiffness, and swelling ?Take over-the-counter and prescription medicines only as told by your health care provider. Treatment may include medicines for pain and inflammation that are taken by mouth or applied to the skin, or muscle relaxants. ?Your health care provider may recommend applying ice during the first 24-48 hours after your pain starts. To do this: ?Put ice in a plastic bag. ?Place a towel between your skin and the bag. ?Leave the ice on for 20 minutes, 2-3 times a day. ?Remove the ice if your skin turns bright red. This is very important. If you cannot feel pain, heat, or cold, you have a greater risk of damage to the area. ?If directed, apply heat to the affected area as often as told by your health care provider. Use the heat source that your health care provider recommends, such as a moist heat pack or a heating pad. ?Place a towel between your skin and the heat source. ?Leave the heat on for 20-30 minutes. ?Remove the heat if your skin turns bright red. This is especially important if you are unable to feel pain, heat, or cold. You have a greater risk of getting burned. ?Activity ? ?Do not stay in bed. Staying in bed for more than 1-2 days can delay your recovery. ?Sit up and stand up straight. Avoid leaning forward when you sit or hunching over when you stand. ?If you work at a desk, sit close to it so you do not  need to lean over. Keep your chin tucked in. Keep your neck drawn back, and keep your elbows bent at a 90-degree angle (right angle). ?Sit high and close to the steering wheel when you drive. Add lower back (lumbar) support to your car seat, if needed. ?Take short walks on even surfaces as soon as you are able. Try to increase the length of time you walk each day. ?Do not sit, drive, or stand in one place for more than 30 minutes at a time. Sitting or standing for long periods of time can put stress on your back. ?Do not drive or use heavy machinery while taking prescription pain medicine. ?Use proper lifting techniques. When you bend and lift, use positions that put less stress on your back: ?[place] your knees. ?Keep the load close to your body. ?Avoid twisting. ?Exercise regularly as told by your health care provider. Exercising helps your back heal faster and helps prevent back injuries by keeping muscles strong and flexible. ?Work with a physical therapist to make a safe exercise program, as recommended by your health care provider. Do any exercises as told by your physical therapist. ?Lifestyle ?Maintain a healthy weight. Extra weight puts stress on your back  and makes it difficult to have good posture. ?Avoid activities or situations that make you feel anxious or stressed. Stress and anxiety increase muscle tension and can make back pain worse. Learn ways to manage anxiety and stress, such as through exercise. ?General instructions ?Sleep on a firm mattress in a comfortable position. Try lying on your side with your knees slightly bent. If you lie on your back, put a pillow under your knees. ?Keep your head and neck in a straight line with your spine (neutral position) when using electronic equipment like smartphones or pads. To do this: ?Raise your smartphone or pad to look at it instead of bending your head or neck to look down. ?Put the smartphone or pad at the level of your face while looking at the  screen. ?Follow your treatment plan as told by your health care provider. This may include: ?Cognitive or behavioral therapy. ?Acupuncture or massage therapy. ?Meditation or yoga. ?Contact a health care provider if: ?Heather Hendrix

## 2021-06-29 NOTE — Assessment & Plan Note (Signed)
Patient is establishing care.  Completed assessment and provided education to patient on health maintenance and preventative care.  Patient will make an appointment for physical and labs in the future.  Handout provided to patient on health maintenance. ?

## 2021-06-29 NOTE — Progress Notes (Signed)
? ? ? ?New Patient Note ? ?RE: Heather Hendrix MRN: [id number] DOB: 10/13/1995 ?Date of Office Visit: 06/29/2021 ? ?Chief Complaint: Back Pain (Starts at shoulders down to lower back. Getting worse. Hx of scoliosis per pt. Dx at age 24-11. Has not had any follow up. Vaginal delivery 5269m ago.) ? ?History of Present Illness: ? ? ?Patient is a 26 year old female who presents to clinic to establish care with complaints of back pain, neck pain and history of scoliosis. ? ? ? ?Back Pain ?This is a recurrent problem. The current episode started 1 to 4 weeks ago. The problem occurs constantly. The problem has been gradually worsening since onset. The pain is present in the lumbar spine. The quality of the pain is described as aching and shooting. The pain does not radiate. The pain is at a severity of 7/10. The pain is severe. The pain is The same all the time. The symptoms are aggravated by bending, lying down, sitting and twisting. Stiffness is present All day. Pertinent negatives include no bladder incontinence, bowel incontinence, chest pain, dysuria, fever, headaches, numbness, paresthesias or pelvic pain. Risk factors: Scoliosis patient reported. She has tried nothing for the symptoms.  ?Neck Pain  ?This is a new problem. The current episode started in the past 7 days. The problem occurs constantly. The problem has been unchanged. The pain is associated with lifting a heavy object. The quality of the pain is described as aching. The pain is at a severity of 7/10. The pain is severe. The symptoms are aggravated by position. The pain is Same all the time. Pertinent negatives include no chest pain, fever, headaches, numbness, pain with swallowing or syncope. She has tried nothing for the symptoms.   ? ?Patient also reports that she was diagnosed with scoliosis at the age of 10-11 but was never treated or had imaging done.  She reports pain is worse after childbirth. ? ? ?Assessment and Plan: ?[name] is a 26 y.o.  female with: ?Encounter to establish care ?Patient is establishing care.  Completed assessment and provided education to patient on health maintenance and preventative care.  Patient will make an appointment for physical and labs in the future.  Handout provided to patient on health maintenance. ? ?Back pain without sciatica ?Patient reports history of scoliosis.  Scoliosis has never been treated, or evaluated.  Patient is reporting worsening back pain without sciatica.  Started patient on Robaxin 500 mg tablet by mouth as needed for muscle spasm, anti-inflammatory [ibuprofen] 600 mg tablet by mouth as needed for pain. ?Back strengthening exercises recommended.  Rx sent to pharmacy.  Follow-up with worsening unresolved symptoms. ? ?Neck pain ?Unresolved neck pain aggravated by history of scoliosis.  Completed lumbar/back x-ray to rule out deformity.  Patient will need referral to orthopedic for reassessment.  Advised patient to put on brace, anti-inflammatory and muscle relaxant for pain and muscle spasm as needed.  Follow-up for worsening unresolved symptoms.  Education handout printed and given to patient. ? ?Return if symptoms worsen or fail to improve. ? ? ?Diagnostics: ? ? ?Past Medical History: ?Patient Active Problem List  ? Diagnosis Date Noted  ? Back pain without sciatica 06/29/2021  ? Neck pain 06/29/2021  ? Encounter to establish care 03/25/2021  ? Vaginal delivery 03/25/2021  ? Hypokalemia 03/24/2021  ? Herpes 02/16/2021  ? Rubella non-immune status, antepartum 12/08/2020  ? Marijuana use 12/08/2020  ? ?Past Medical History:  ?Diagnosis Date  ? PCOS (polycystic ovarian syndrome)   ? Scoliosis   ? ?  Past Surgical History: ?Past Surgical History:  ?Procedure Laterality Date  ? VAGINAL DELIVERY    ? January 2023  ? ?Medication List:  ?Current Outpatient Medications  ?Medication Sig Dispense Refill  ? acetaminophen (TYLENOL) 325 MG tablet Take 2 tablets (650 mg total) by mouth every 4 (four) hours as needed  (for pain scale < 4). 60 tablet 0  ? acyclovir (ZOVIRAX) 400 MG tablet Take 1 tablet (400 mg total) by mouth 3 (three) times daily. 90 tablet 3  ? ibuprofen (ADVIL) 600 MG tablet Take 1 tablet (600 mg total) by mouth every 8 (eight) hours as needed. 30 tablet 0  ? methocarbamol (ROBAXIN) 500 MG tablet Take 1 tablet (500 mg total) by mouth 4 (four) times daily. 30 tablet 1  ? Prenatal Vit-Fe Fumarate-FA (PRENATAL VITAMIN PO) Take 1 tablet by mouth daily.    ? ?No current facility-administered medications for this visit.  ? ?Allergies: ?No Known Allergies ?Social History: ?Social History  ? ?Socioeconomic History  ? Marital status: Single  ?  Spouse name: Not on file  ? Number of children: Not on file  ? Years of education: Not on file  ? Highest education level: Not on file  ?Occupational History  ? Occupation: Dog Groomer  ?Tobacco Use  ? Smoking status: Every Day  ?  Packs/day: 1.00  ?  Types: Cigarettes  ? Smokeless tobacco: Never  ?Vaping Use  ? Vaping Use: Former  ?Substance and Sexual Activity  ? Alcohol use: Not Currently  ? Drug use: Yes  ?  Types: Hydrocodone, Other-see comments  ?  Comment: xanax  ? Sexual activity: Yes  ?  Birth control/protection: None  ?Other Topics Concern  ? Not on file  ?Social History Narrative  ? Not on file  ? ?Social Determinants of Health  ? ?Financial Resource Strain: Low Risk   ? Difficulty of Paying Living Expenses: Not very hard  ?Food Insecurity: No Food Insecurity  ? Worried About Programme researcher, broadcasting/film/video in the Last Year: Never true  ? Ran Out of Food in the Last Year: Never true  ?Transportation Needs: No Transportation Needs  ? Lack of Transportation (Medical): No  ? Lack of Transportation (Non-Medical): No  ?Physical Activity: Insufficiently Active  ? Days of Exercise per Week: 5 days  ? Minutes of Exercise per Session: 20 min  ?Stress: Stress Concern Present  ? Feeling of Stress : To some extent  ?Social Connections: Moderately Integrated  ? Frequency of Communication with  Friends and Family: Twice a week  ? Frequency of Social Gatherings with Friends and Family: Once a week  ? Attends Religious Services: 1 to 4 times per year  ? Active Member of Clubs or Organizations: No  ? Attends Banker Meetings: Never  ? Marital Status: Living with partner  ? ? ? ? ? ?Family History: ?Family History  ?Problem Relation Age of Onset  ? Heart disease Maternal Grandfather   ? COPD Paternal Grandmother   ? ?     ? ?Review of Systems  ?Constitutional: Negative.  Negative for fever.  ?HENT: Negative.    ?Cardiovascular:  Negative for chest pain and syncope.  ?Gastrointestinal: Negative.  Negative for bowel incontinence.  ?Genitourinary:  Negative for bladder incontinence, dysuria and pelvic pain.  ?Musculoskeletal:  Positive for back pain and neck pain.  ?Neurological:  Negative for numbness, headaches and paresthesias.  ?All other systems reviewed and are negative. ?Objective: ?BP 127/83   Pulse 80   Ht  5\' 6"  (1.676 m)   Wt 168 lb (76.2 kg)   SpO2 99%   BMI 27.12 kg/m?  ?Body mass index is 27.12 kg/m? ?Physical Exam ?Vitals and nursing note reviewed.  ?Constitutional:   ?   Appearance: Normal appearance.  ?HENT:  ?   Head: Normocephalic.  ?   Right Ear: External ear normal.  ?   Left Ear: External ear normal.  ?   Nose: Nose normal.  ?Eyes:  ?   Conjunctiva/sclera: Conjunctivae normal.  ?Cardiovascular:  ?   Rate and Rhythm: Normal rate and regular rhythm.  ?   Pulses: Normal pulses.  ?   Heart sounds: Normal heart sounds.  ?Pulmonary:  ?   Effort: Pulmonary effort is normal.  ?   Breath sounds: Normal breath sounds.  ?Abdominal:  ?   General: Bowel sounds are normal.  ?Skin: ?   General: Skin is warm.  ?   Findings: No rash.  ?Neurological:  ?   General: No focal deficit present.  ?   Mental Status: She is alert and oriented to person, place, and time.  ? ?The plan was reviewed with the patient/family, and all questions/concerned were addressed. ? ?It was my pleasure to see  Ritamarie today and participate in her care. Please feel free to contact me with any questions or concerns. ? ?Sincerely, ? ?Lelon Mast NP ?[facility] ?

## 2021-06-29 NOTE — Assessment & Plan Note (Signed)
Patient reports history of scoliosis.  Scoliosis has never been treated, or evaluated.  Patient is reporting worsening back pain without sciatica.  Started patient on Robaxin 500 mg tablet by mouth as needed for muscle spasm, anti-inflammatory [ibuprofen] 600 mg tablet by mouth as needed for pain. ?Back strengthening exercises recommended.  Rx sent to pharmacy.  Follow-up with worsening unresolved symptoms. ?

## 2021-07-14 ENCOUNTER — Encounter: Payer: Self-pay | Admitting: Nurse Practitioner

## 2021-07-14 ENCOUNTER — Ambulatory Visit: Payer: Medicaid Other | Admitting: Nurse Practitioner

## 2021-07-14 VITALS — BP 114/77 | HR 104 | Temp 98.0°F | Resp 20 | Ht 66.0 in | Wt 175.0 lb

## 2021-07-14 DIAGNOSIS — Z720 Tobacco use: Secondary | ICD-10-CM

## 2021-07-14 DIAGNOSIS — Z3049 Encounter for surveillance of other contraceptives: Secondary | ICD-10-CM

## 2021-07-14 DIAGNOSIS — Z30019 Encounter for initial prescription of contraceptives, unspecified: Secondary | ICD-10-CM | POA: Diagnosis not present

## 2021-07-14 LAB — PREGNANCY, URINE: Preg Test, Ur: NEGATIVE

## 2021-07-14 MED ORDER — LO LOESTRIN FE 1 MG-10 MCG / 10 MCG PO TABS: 1.0000 | ORAL_TABLET | Freq: Every day | ORAL | 11 refills | Status: AC

## 2021-07-14 NOTE — Patient Instructions (Signed)

## 2021-07-14 NOTE — Progress Notes (Signed)
? ?  Acute Office Visit ? ?Subjective:  ? ?  ?Patient ID: Heather Hendrix, female    DOB: 11-May-1995, 26 y.o.   MRN: [id number] ? ?Chief Complaint  ?Patient presents with  ? Contraception  ? ? ?HPI ? Patient is a 26 year old female who presents to clinic today for encounter for female birth control.  Last menstrual period 3 weeks ago.  No current birth control for pregnancy prevention in place.  Patient is a new mother in the last 4 months. ? ?Smoking cessation instruction/counseling given:  counseled patient on the dangers of tobacco use, advised patient to stop smoking, and reviewed strategies to maximize success  ? ?Review of Systems  ?Constitutional: Negative.   ?HENT: Negative.    ?Gastrointestinal: Negative.  Negative for abdominal pain, nausea and vomiting.  ?Genitourinary: Negative.   ?Skin: Negative.  Negative for rash.  ?Neurological: Negative.   ?Psychiatric/Behavioral: Negative.    ?All other systems reviewed and are negative. ? ? ?   ?Objective:  ?  ?BP 114/77   Pulse (!) 104   Temp 98 ?F (36.7 ?C)   Resp 20   Ht 5\' 6"  (1.676 m)   Wt 175 lb (79.4 kg)   LMP 07/07/2021   SpO2 97%   Breastfeeding No   BMI 28.25 kg/m?  ?BP Readings from Last 3 Encounters:  ?07/14/21 114/77  ?06/29/21 127/83  ?03/27/21 131/77  ? ?Wt Readings from Last 3 Encounters:  ?07/14/21 175 lb (79.4 kg)  ?06/29/21 168 lb (76.2 kg)  ?03/24/21 153 lb 9.6 oz (69.7 kg)  ? ?  ? ?Physical Exam ?Vitals and nursing note reviewed.  ?Constitutional:   ?   Appearance: Normal appearance.  ?HENT:  ?   Head: Normocephalic.  ?   Right Ear: External ear normal.  ?   Left Ear: External ear normal.  ?   Nose: Nose normal.  ?   Mouth/Throat:  ?   Mouth: Mucous membranes are moist.  ?   Pharynx: Oropharynx is clear.  ?Eyes:  ?   Conjunctiva/sclera: Conjunctivae normal.  ?Cardiovascular:  ?   Rate and Rhythm: Normal rate.  ?Pulmonary:  ?   Effort: Pulmonary effort is normal.  ?Abdominal:  ?   General: Bowel sounds are normal.  ?Musculoskeletal:      ?   General: Normal range of motion.  ?Skin: ?   General: Skin is warm.  ?   Findings: No rash.  ?Neurological:  ?   General: No focal deficit present.  ?   Mental Status: She is alert and oriented to person, place, and time.  ?Psychiatric:     ?   Behavior: Behavior normal.  ? ? ? ? ?No results found for any visits on 07/14/21. ? ? ?   ?Assessment & Plan:  ? ?Problem List Items Addressed This Visit   ? ?  ? Other  ? Encounter for female birth control - Primary  ?  Education completed for birth control, urine pregnancy test completed results pending. ? ?  ?  ? Relevant Orders  ? Pregnancy, urine  ? Tobacco abuse disorder  ?  Counseling provided on dangers of tobacco use. ? ?  ?  ? ? ?No orders of the defined types were placed in this encounter. ? ? ?Return if symptoms worsen or fail to improve. ? ?07/16/21, NP ? ? ?

## 2021-07-14 NOTE — Addendum Note (Signed)
Addended by: Daryll Drown on: 07/14/2021 02:57 PM ? ? Modules accepted: Orders ? ?

## 2021-07-14 NOTE — Assessment & Plan Note (Signed)
Education completed for birth control, urine pregnancy test completed results pending. ?

## 2021-07-14 NOTE — Assessment & Plan Note (Signed)
Counseling provided on dangers of tobacco use. ?

## 2021-07-15 ENCOUNTER — Encounter: Payer: Self-pay | Admitting: Emergency Medicine

## 2021-08-03 ENCOUNTER — Other Ambulatory Visit: Payer: Self-pay | Admitting: Nurse Practitioner

## 2021-08-03 DIAGNOSIS — M542 Cervicalgia: Secondary | ICD-10-CM

## 2021-08-03 DIAGNOSIS — M5489 Other dorsalgia: Secondary | ICD-10-CM

## 2022-05-17 ENCOUNTER — Encounter: Payer: Medicaid Other | Admitting: *Deleted

## 2022-05-17 ENCOUNTER — Encounter: Payer: Medicaid Other | Admitting: Women's Health

## 2022-10-05 IMAGING — US US MFM OB COMP +14 WKS
1 series · 14 of 28 positions shown · non-contrast
Comparison: none

[Series 1: us mfm ob comp +14 wks · 43 acquisitions, 14 frames shown]
[im 2/43]
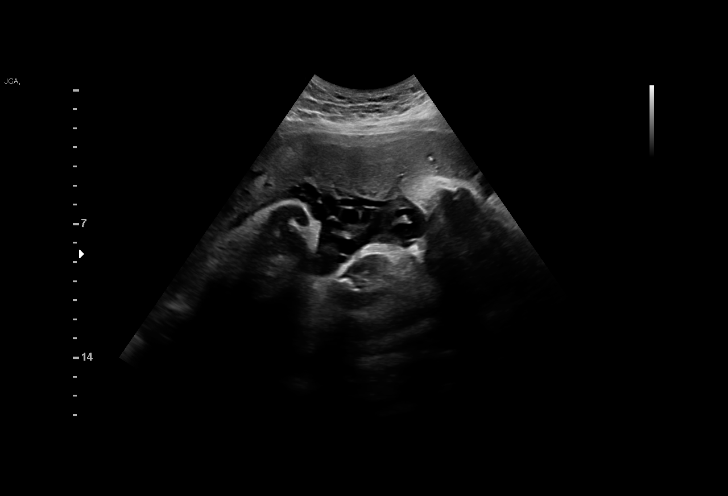
[im 5/43]
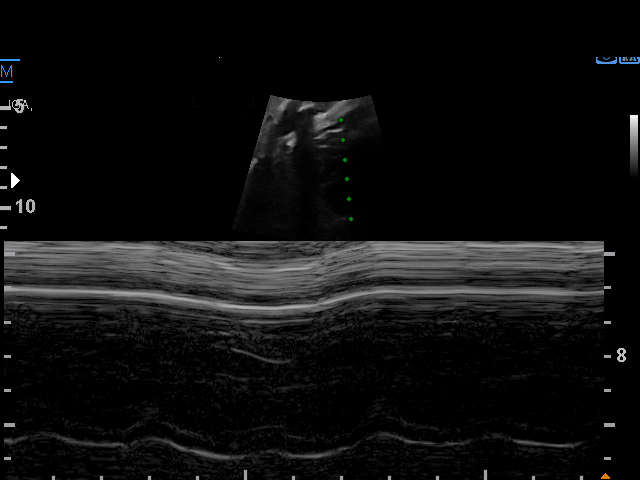
[im 8/43]
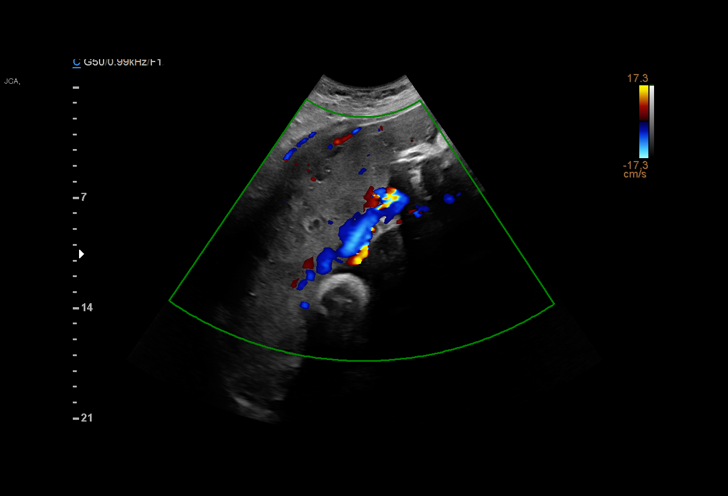
[im 11/43]
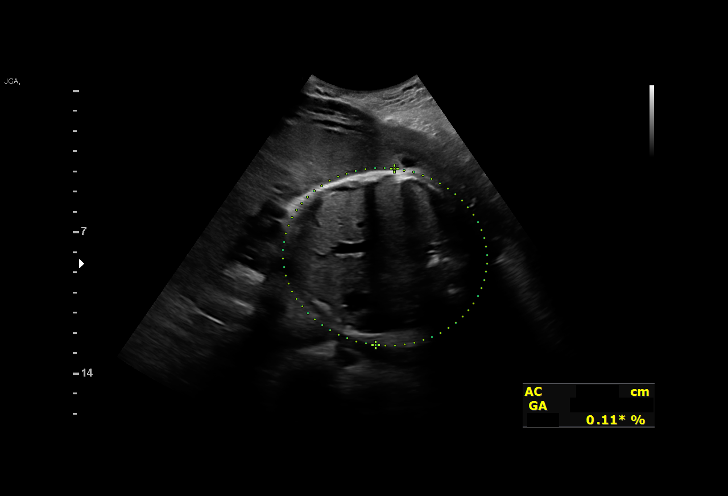
[im 15/43]
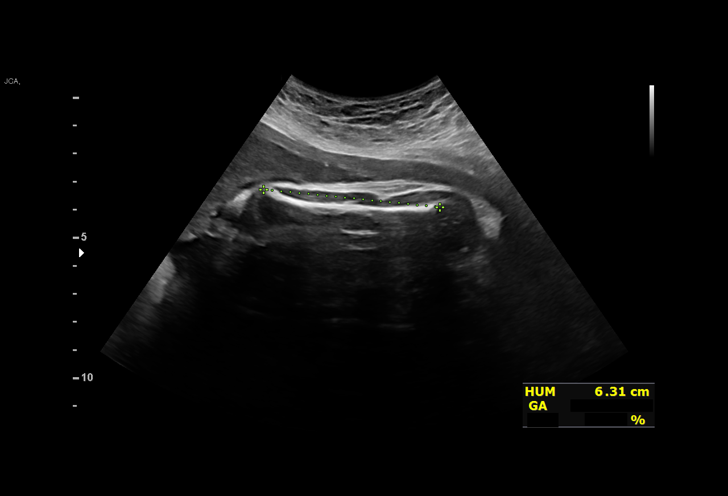
[im 18/43]
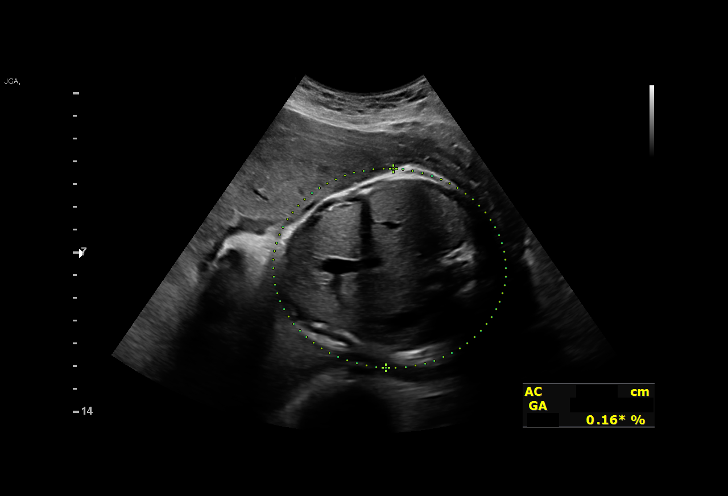
[im 21/43]
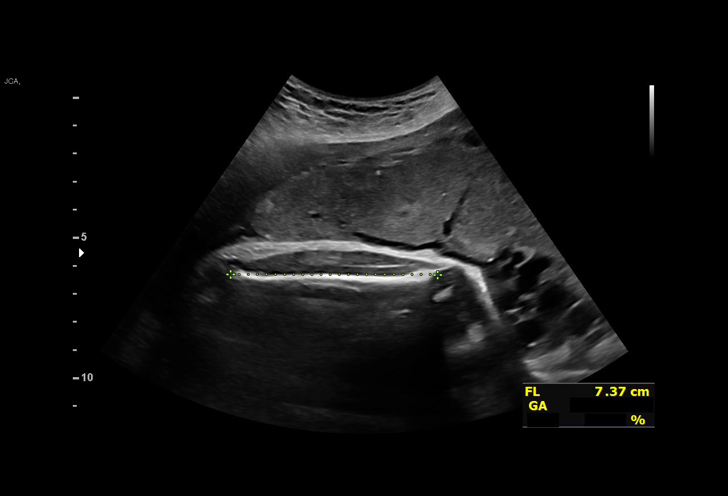
[im 24/43]
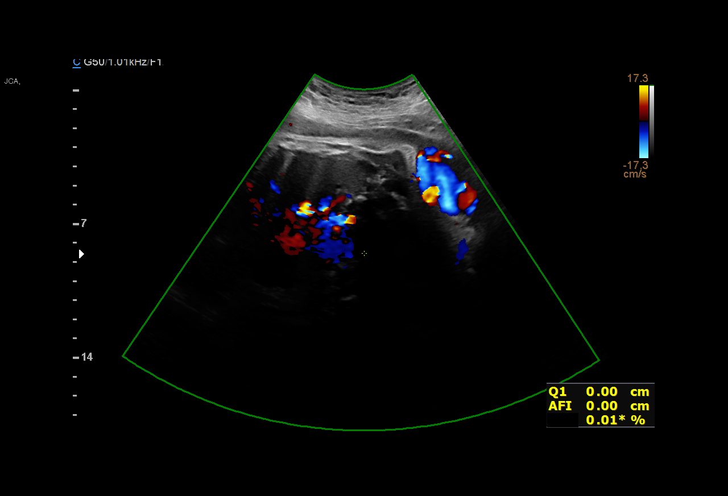
[im 27/43]
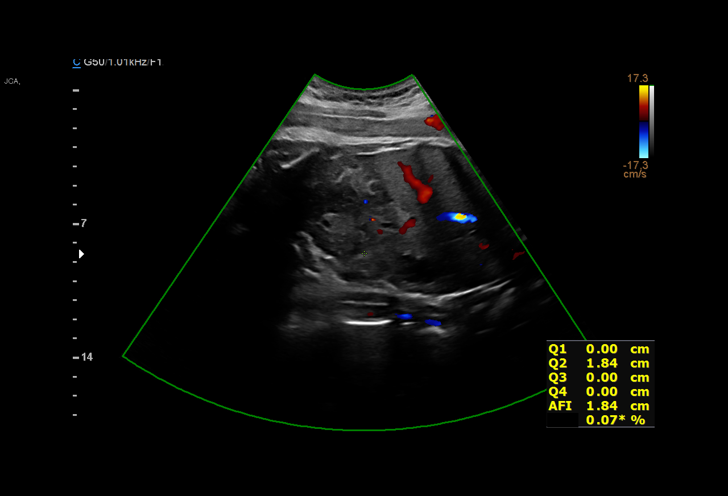
[im 30/43]
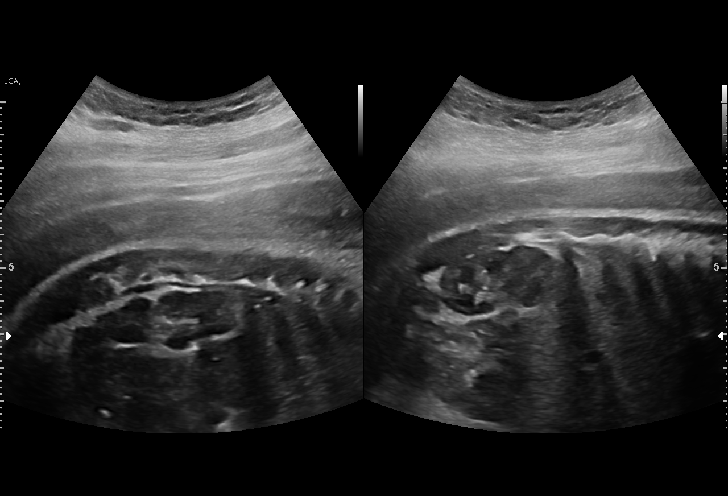
[im 33/43]
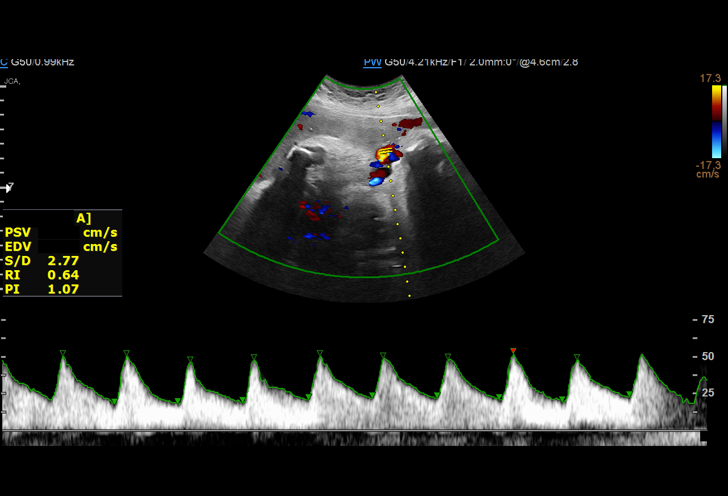
[im 36/43]
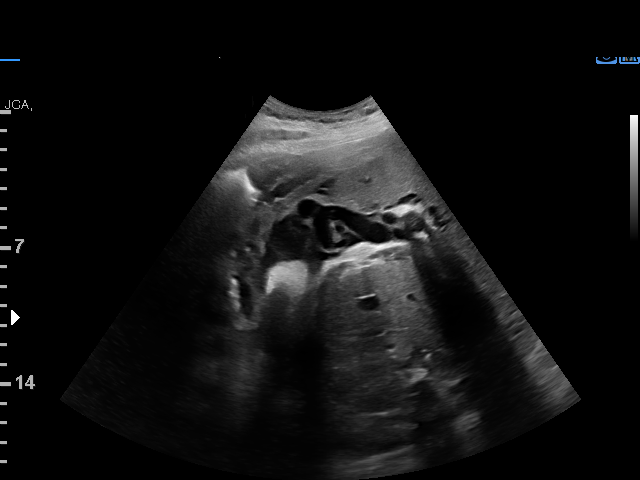
[im 39/43]
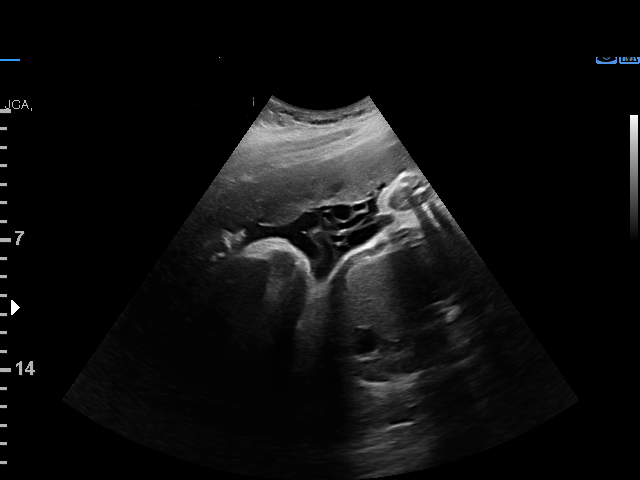
[im 43/43]
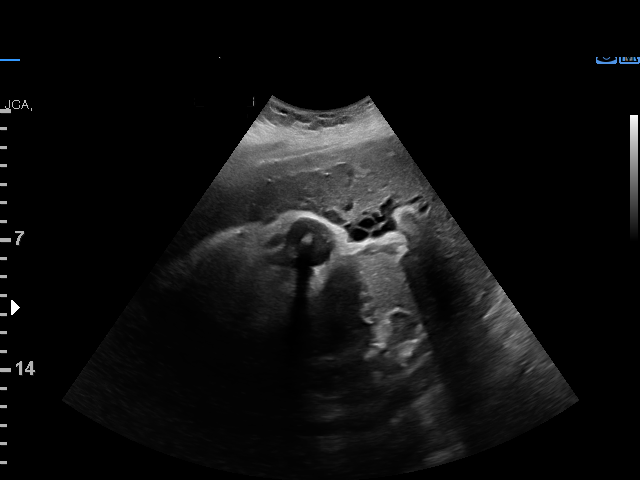

[14 of 28 positions shown; findings below may reference images not displayed]

Attending:        Xeyam Rus        Secondary Phy.:   [facility] MAU/Triage

 1  US MFM OB COMP + 14 WK                76805.01    ALIOUNE PALADINO
 3  US MFM UA CORD DOPPLER                76820.02    ALIOUNE PALADINO

Indications

 Uterine size-date discrepancy, third trimester
 Oligohydraminios, third trimester, unspecified
 Maternal care for known or suspected poor
 fetal growth, third trimester, not applicable or
 unspecified IUGR
 Encounter for antenatal screening for
 malformations
 38 weeks gestation of pregnancy
Fetal Evaluation

 Num Of Fetuses:         1
 Fetal Heart Rate(bpm):  143
 Cardiac Activity:       Observed
 Presentation:           Cephalic
 Placenta:               Anterior
 P. Cord Insertion:      Not well visualized

 Amniotic Fluid
 AFI FV:      Oligohydramnios

 AFI Sum(cm)     %Tile
 1.8             < 3

 RUQ(cm)       RLQ(cm)       LUQ(cm)        LLQ(cm)
 0             0             1.8            0

 Comment:    No subchorionic hemorrhage seen. No placental abruption or
             previa identified.
Biophysical Evaluation

 Amniotic F.V:   Pocket < 2 cm two          F. Tone:        Observed
                 planes
 F. Movement:    Not Observed               Score:          [DATE]
 F. Breathing:   Not Observed
Biometry

 BPD:      95.6  mm     G. Age:  39w 0d         88  %    CI:        84.51   %    70 - 86
                                                         FL/HC:      22.7   %    20.9 -
 HC:      327.7  mm     G. Age:  37w 1d         11  %    HC/AC:      1.12        0.92 -
 AC:      293.3  mm     G. Age:  33w 2d        < 1  %    FL/BPD:     77.7   %    71 - 87
 FL:       74.3  mm     G. Age:  38w 0d         44  %    FL/AC:      25.3   %    20 - 24
 HUM:      64.1  mm     G. Age:  37w 1d         53  %

 Est. FW:    9491  gm           6 lb      8  %
Gestational Age

 U/S Today:     36w 6d                                        EDD:   04/15/21
 Best:          38w 3d     Det. By:  Early Ultrasound         EDD:   04/04/21
                                     (08/14/20)
Doppler - Fetal Vessels

 Umbilical Artery
  S/D     %tile      RI    %tile      PI    %tile            ADFV    RDFV
  2.41       60    0.58       64    1.04       91               No      No

Impression

 Uterine-size-date discrepancy.  Maternal weight loss.

 On ultrasound, oligohydramnios was seen.  The estimated
 fetal weight was at 8 percentile.  Abdominal circumference
 measurement is at the 1st percentile.  Fetal movements and
 fetal breathing movements did not meet the criteria of BPP.
 BPP [DATE].

 Patient later had an uneventful vaginal delivery.  Erxleben
 scores [DATE].
                 Magner, Bibi

## 2022-10-25 ENCOUNTER — Other Ambulatory Visit: Payer: Self-pay | Admitting: *Deleted

## 2022-10-25 DIAGNOSIS — Z30019 Encounter for initial prescription of contraceptives, unspecified: Secondary | ICD-10-CM

## 2022-10-25 NOTE — Telephone Encounter (Signed)
Je pt NTBS by new provider last PE 07/14/21 NO RF sent to pharmacy, last PE/OV greater than a year

## 2022-10-25 NOTE — Telephone Encounter (Signed)
Pt is pregnant and does not need this

## 2023-01-10 IMAGING — DX DG LUMBAR SPINE 2-3V
2 series · 2 of 2 positions shown · non-contrast
Comparison: None.

CLINICAL DATA: Back pain.

EXAM:
LUMBAR SPINE - 2-3 VIEW

[l-spine ap]
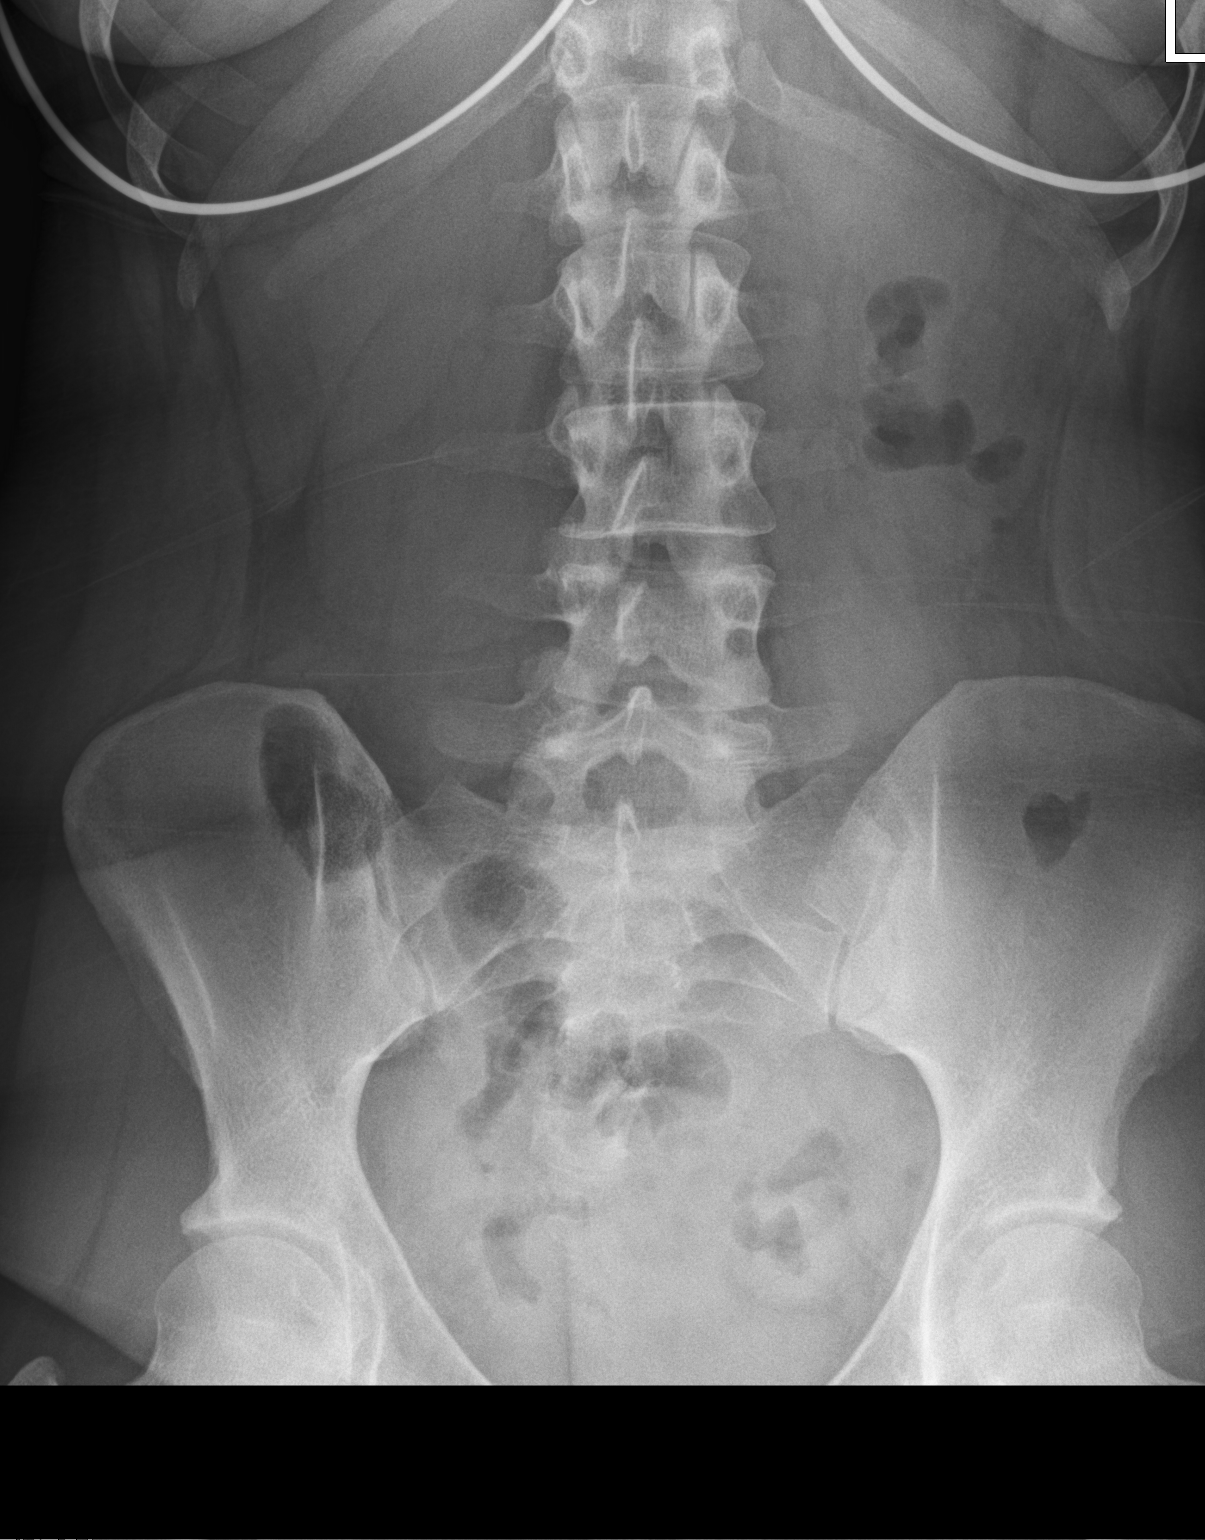

[l-spine lat]
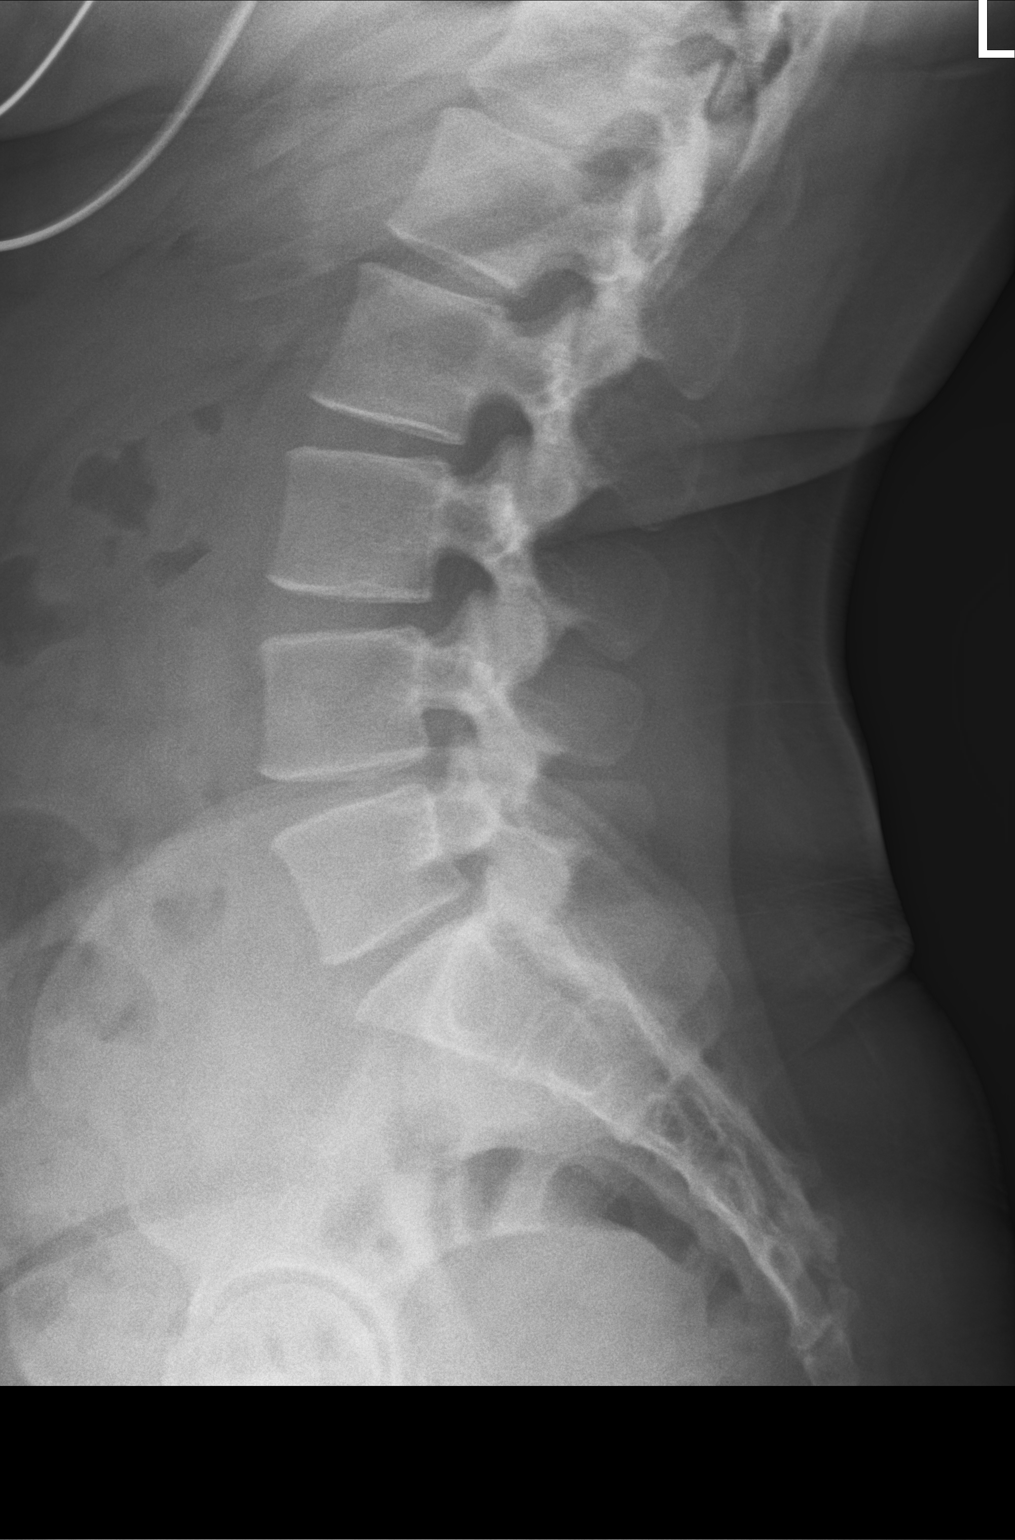

[2 of 2 positions shown; findings below may reference images not displayed]

FINDINGS: There is no evidence of lumbar spine fracture. Alignment is normal.
Intervertebral disc spaces are maintained.
IMPRESSION: Negative.
